# Patient Record
Sex: Male | Born: 1960 | Race: White | Hispanic: No | Marital: Married | Smoking: Former smoker
Health system: Southern US, Community
[De-identification: ages and names within clinical notes are randomized; demographics above are authoritative.]

## PROBLEM LIST (undated history)

## (undated) DIAGNOSIS — M1990 Unspecified osteoarthritis, unspecified site: Secondary | ICD-10-CM

## (undated) DIAGNOSIS — Z8619 Personal history of other infectious and parasitic diseases: Secondary | ICD-10-CM

## (undated) HISTORY — PX: VARICOCELE EXCISION: SUR582

## (undated) HISTORY — PX: BACK SURGERY: SHX140

## (undated) HISTORY — PX: NECK SURGERY: SHX720

## (undated) HISTORY — PX: OTHER SURGICAL HISTORY: SHX169

## (undated) HISTORY — DX: Personal history of other infectious and parasitic diseases: Z86.19

## (undated) HISTORY — PX: KNEE SURGERY: SHX244

## (undated) HISTORY — DX: Unspecified osteoarthritis, unspecified site: M19.90

---

## 2004-09-12 ENCOUNTER — Encounter: Admission: RE | Admit: 2004-09-12 | Discharge: 2004-09-12 | Payer: Self-pay | Admitting: Family Medicine

## 2008-06-17 ENCOUNTER — Encounter: Admission: RE | Admit: 2008-06-17 | Discharge: 2008-06-17 | Payer: Self-pay | Admitting: Specialist

## 2008-06-24 ENCOUNTER — Encounter (INDEPENDENT_AMBULATORY_CARE_PROVIDER_SITE_OTHER): Payer: Self-pay | Admitting: Specialist

## 2008-06-24 ENCOUNTER — Ambulatory Visit (HOSPITAL_COMMUNITY): Admission: RE | Admit: 2008-06-24 | Discharge: 2008-06-25 | Payer: Self-pay | Admitting: Specialist

## 2011-03-21 NOTE — Op Note (Signed)
NAME:  TRASK, VOSLER                ACCOUNT NO.:  [id number]   MEDICAL RECORD NO.:  [id number]          PATIENT TYPE:  AMB   LOCATION:  DAY                          FACILITY:  [facility]   PHYSICIAN:  Jene Every, M.D.    DATE OF BIRTH:  February 18, 1961   DATE OF PROCEDURE:  06/24/2008  DATE OF DISCHARGE:                               OPERATIVE REPORT   PREOPERATIVE DIAGNOSIS:  Spinal stenosis, herniated nucleus pulposus at  3-4, 4-5.   POSTOPERATIVE DIAGNOSIS:  Spinal stenosis, herniated nucleus pulposus at  3-4, 4-5.   PROCEDURES PERFORMED:  1. Central decompression at 3-4 for foraminotomies of L4 and      microdiskectomy L3-L4.  2. Hemilaminotomy, lateral recess decompression, foraminotomy of L4-      L5.   ANESTHESIA:  General.   SURGEON:  Jene Every, M.D.   ASSISTANTIran Ouch   BRIEF HISTORY AND INDICATIONS:  The patient is a 50 year old with acute  left lower extremity radicular pain in L3 nerve root distribution,  quadriceps weakness, numbness in the anterior thigh.  He has also had a  longstanding chronic radicular pain into the great toe.  He had a  positive neural tension sign, EHL weakness, quadriceps weakness.  MRI  indicating a disk herniation migrating cephalad into the neuroforamen of  3.  Also central and lateral recess stenosis at 4-5 on the left.  He was  indicated for decompression due to his acute neurologic deficit.  Risks  and benefits were discussed including bleeding, infection, damage to  neurovascular structures, CSF leakage, epidural fibrosis, adjacent  segment disease, need for fusion in the future, DVT, PE, anesthetic  complications and so forth.   TECHNIQUE:  The patient was placed in the supine position.  After  induction of adequate general anesthesia and 1 gram vancomycin due to  the patient's exposure to MRSA, placed prone on the Kingsley frame.  All  bony prominences were well padded.  Lumbar region was prepped and draped  in the usual sterile  fashion.  Two 18-gauge spinal needles were utilized  to localize 3-4, 4-5 interspace, confirmed with x-ray.  Incision was  made from the spinous process 3 to below 5.  Subcutaneous tissue was  dissected.  Bipolar cautery was utilized to achieve hemostasis.  Dorsolumbar fascia identified and divided in line with the skin incision  on either side of the lamina of 3 and 4 and on the left at 4-5.  McCullough retractor was placed.  Operating microscope draped and  brought onto the surgical field.   Through the small interlaminar window and the narrow lamina and to  preserve the pars, we removed the interspinous ligament and performed a  central decompression.  We performed hemilaminotomies at the caudad edge  of 3, the cephalad edge of 4 and removed ligamentum flavum from the  interspace, gently retracting the thecal sac medially, undercutting the  facet, preserving the pars.  Noted was a focal HNP at the disk space  migrating cephalad out into the 3 foramen, compressing the 3 and the 4  roots.  The neural elements were well protected.  Performed an  annulotomy and disk material extruded and was retrieved with a micro  pituitary with multiple passes.  Fragments were further mobilized from  the foramen out laterally and medially with a nerve hook and a micro  pituitary.  The micro pituitary was also used to enter the disk space.   Following this, we appropriately decompressed the 3 and 4 foramen and  beneath the thecal sac.  Only intermittent traction was utilized  throughout the case.  Disk space was copiously irrigated with antibiotic  irrigation.  I took a confirmatory radiograph with a hockey stick in the  foramen of 4.   Next, we then moved to the interspace at 4-5 and we removed the  ligamentum flavum after detaching it from the cephalad edge of 5 with a  straight curette, performed a hemilaminotomy of 5 and of the inferior  process of 4, removing ligamentum flavum from the interspace.   There was  significant lateral recess stenosis with compression of the 5 root.  We  decompressed the lateral recess to the medial border pedicle and  performed a foraminotomy of 5.  This decompressed the 5 root.  We gently  mobilized it medially.  There was a focal hardened disk protrusion there  not amenable to diskectomy.  I felt that the decompression of lateral  recess and the foraminotomy was adequate and a hockey stick probe was  placed freely out the foramen of 4 and 5 beneath the thecal sac without  residual compression noted upon the 5 root.   The disk spaces were copiously irrigated.  Inspection revealed no CSF  leakage or active bleeding.  Thrombin-soaked Gelfoam was placed in the  laminotomy defects.  The McCullough retractor was removed.  Paraspinous  muscle was irrigated with no evidence of active bleeding.  Dorsolumbar  fascia was reapproximated with #1 Vicryl interrupted figure-of-eight  sutures and through the interspinous ligament.  Subcutaneous tissue was  reapproximated with 2-0 Vicryl simple sutures.  The skin was  reapproximated with staples and the wound was dressed sterilely.  He was  placed supine on hospital bed, extubated without difficulty and  transferred to the recovery room in satisfactory condition.   The patient tolerated the procedure well, no complications.  __________.  Minimal blood loss.      Jene Every, M.D.  Electronically Signed     JB/MEDQ  D:  06/24/2008  T:  06/24/2008  Job:  [id number]

## 2011-04-07 ENCOUNTER — Other Ambulatory Visit: Payer: Self-pay | Admitting: Family Medicine

## 2011-04-07 DIAGNOSIS — M542 Cervicalgia: Secondary | ICD-10-CM

## 2011-04-08 ENCOUNTER — Ambulatory Visit
Admission: RE | Admit: 2011-04-08 | Discharge: 2011-04-08 | Disposition: A | Discharge status: Home / Self Care | Payer: BC Managed Care – PPO | Source: Ambulatory Visit | Attending: Family Medicine | Admitting: Family Medicine

## 2011-04-08 DIAGNOSIS — M542 Cervicalgia: Secondary | ICD-10-CM

## 2011-05-18 ENCOUNTER — Encounter (HOSPITAL_COMMUNITY)
Admission: RE | Admit: 2011-05-18 | Discharge: 2011-05-18 | Disposition: A | Discharge status: Home / Self Care | Payer: BC Managed Care – PPO | Source: Ambulatory Visit | Attending: Neurosurgery | Admitting: Neurosurgery

## 2011-05-18 LAB — CBC
HCT: 44.3 % (ref 39.0–52.0)
Hemoglobin: 15.9 g/dL (ref 13.0–17.0)
MCH: 33.2 pg (ref 26.0–34.0)
MCHC: 35.9 g/dL (ref 30.0–36.0)
MCV: 92.5 fL (ref 78.0–100.0)
Platelets: 191 10*3/uL (ref 150–400)
RBC: 4.79 MIL/uL (ref 4.22–5.81)
RDW: 13.3 % (ref 11.5–15.5)
WBC: 9 10*3/uL (ref 4.0–10.5)

## 2011-05-18 LAB — SURGICAL PCR SCREEN
MRSA, PCR: NEGATIVE
Staphylococcus aureus: POSITIVE — AB

## 2011-05-25 ENCOUNTER — Observation Stay (HOSPITAL_COMMUNITY)
Admission: RE | Admit: 2011-05-25 | Discharge: 2011-05-25 | Disposition: A | Discharge status: Home / Self Care | Payer: BC Managed Care – PPO | Source: Ambulatory Visit | Attending: Neurosurgery | Admitting: Neurosurgery

## 2011-05-25 ENCOUNTER — Ambulatory Visit (HOSPITAL_COMMUNITY): Payer: BC Managed Care – PPO

## 2011-05-25 DIAGNOSIS — M5000 Cervical disc disorder with myelopathy, unspecified cervical region: Secondary | ICD-10-CM | POA: Insufficient documentation

## 2011-05-25 DIAGNOSIS — M4712 Other spondylosis with myelopathy, cervical region: Principal | ICD-10-CM | POA: Insufficient documentation

## 2011-05-25 DIAGNOSIS — Z01812 Encounter for preprocedural laboratory examination: Secondary | ICD-10-CM | POA: Insufficient documentation

## 2011-05-25 DIAGNOSIS — F17200 Nicotine dependence, unspecified, uncomplicated: Secondary | ICD-10-CM | POA: Insufficient documentation

## 2011-05-25 LAB — COMPREHENSIVE METABOLIC PANEL
ALT: 36 U/L (ref 0–53)
AST: 25 U/L (ref 0–37)
Albumin: 3.9 g/dL (ref 3.5–5.2)
Alkaline Phosphatase: 78 U/L (ref 39–117)
BUN: 12 mg/dL (ref 6–23)
CO2: 27 mEq/L (ref 19–32)
Calcium: 9.9 mg/dL (ref 8.4–10.5)
Chloride: 102 mEq/L (ref 96–112)
Creatinine, Ser: 0.86 mg/dL (ref 0.50–1.35)
GFR calc Af Amer: >60 mL/min (ref >60)
GFR calc non Af Amer: >60 mL/min (ref >60)
Glucose, Bld: 101 mg/dL — ABNORMAL HIGH (ref 70–99)
Potassium: 4.7 mEq/L (ref 3.5–5.1)
Sodium: 139 mEq/L (ref 135–145)
Total Bilirubin: 0.4 mg/dL (ref 0.3–1.2)
Total Protein: 7 g/dL (ref 6.0–8.3)

## 2011-05-30 NOTE — Op Note (Signed)
NAME:  Richard Skinner, TEAL NO.:  [id number]  MEDICAL RECORD NO.:  [id number]  LOCATION:  3526                         FACILITY:  [facility]  PHYSICIAN:  Cristi Loron, M.D.DATE OF BIRTH:  May 14, 1961  DATE OF PROCEDURE:  05/25/2011 DATE OF DISCHARGE:  05/25/2011                              OPERATIVE REPORT   BRIEF HISTORY:  The patient is a 50 year old white male who suffered from neck and arm pain, numbness, tingling, etc. consistent with a cervical myelopathy.  He has failed medical management, was worked up with a cervical MRI which demonstrated that the patient had significant spondylosis at C5-L6, a large ruptured disk at C6-C7.  I discussed various treatment options with the patient and his wife and I have recommended he undergo a C5-C6 and C6-C7 anterior cervical diskectomy, fusion, and plating.  They have weighed the risks, benefits, and alternatives of surgery and decided to proceed with the operation.  PREOPERATIVE DIAGNOSES:  C5-C6 spondylosis, C6-C7 herniated nucleus pulposus, cervical radiculopathy, cervical myelopathy, cervicalgia, cervical spinal stenosis.  POSTOPERATIVE DIAGNOSES:  C5-C6 spondylosis, C6-C7 herniated nucleus pulposus, cervical radiculopathy, cervical myelopathy, cervicalgia, cervical spinal stenosis.  PROCEDURES:  C5-C6 and C6-C7 extensive anterior cervical diskectomy/decompression; C5-C6 anterior interbody arthrodesis with local morselized autograft bone and Actifuse bone graft extender; insertion of interbody prosthesis at C5-C6 and C6-C7 (Zimmer PEEK interbody prosthesis); intracervical plating C5 through C7 with Globus Titanium plate and screws.  SURGEON:  Cristi Loron, MD  ASSISTANT:  Coletta Memos, MD  ANESTHESIA:  General endotracheal.  ESTIMATED BLOOD LOSS:  100 mL.  SPECIMENS:  None.  DRAINS:  None.  COMPLICATIONS:  None.  DESCRIPTION OF PROCEDURE:  The patient was brought to the operating room by the  Anesthesia team.  General endotracheal anesthesia was induced. The patient remained in supine position.  A roll was placed under his shoulders to keep his neck in a neutral position.  His anterior cervical region was then prepared with Betadine scrub and Betadine solution. Sterile drapes were applied.  I then injected the area to be incised with Marcaine with epinephrine solution.  I used a scalpel to make a transverse incision in the patient's left anterior neck.  I used a Metzenbaum scissors to divide the platysma muscle and then to dissect medial to sternocleidomastoid muscle, jugular vein, and carotid artery. I carefully dissected down towards the anterior cervical spine, identifying the esophagus, retracting it medially.  I then used Kittner swabs to clear soft tissue from the anterior cervical spine and then inserted a bent spinal needle into the upper exposed intervertebral disk space.  We obtained intraoperative radiograph to confirm our location. We then used electrocautery to detach the more medial border of the longus colli bilaterally from the C5-C6 and C6-C7 intervertebral disk space.  We inserted a Caspar self-retaining retractor underneath the longus colli muscle bilaterally to provide exposure.  We bent the decompression at C6-C7.  We incised the C6-C7 intervertebral disk with a 15 blade scalpel.  We performed a partial intervertebral diskectomy with the pituitary forceps and the Karlin curettes.  We inserted distraction screws at C6 and C7, distracted interspace.  I then used a high-speed drill to decorticate the  vertebral endplates at C6-C7, to the remainder of C6-C7 intervertebral disk, to drill away some posterior spondylosis and to thin out the posterior longitudinal ligament.  We incised the ligament with an arachnoid knife and then removed with Kerrison punch undercutting the vertebral endplates at C6-C7 decompressing the thecal sac.  We then performed  foraminotomies about the bilateral C7 nerve root and the decompression at this level.  Of note, we did encounter a large herniated disk on the right as expected.  We removed it in multiple fragments using the pituitary forceps and the Kerrison punches.  We then repeated this procedure in an analogous fashion at C5-C6, decompressing the C5-C6 thecal sac and the bilateral C6 nerve root. This completed the decompressions.  We now turned our attention to arthrodesis.  We used trial spacers and determined to use an 8-mm prosthesis at C5-C6 and a 7-mm prosthesis at C6-C7.  We prefilled the prosthesis with combination of local autograft bone we obtained during the decompression as well as Actifuse bone graft extender.  We inserted the prosthesis and distracted the interspaces at C5-C6 and C6-C7.  We then removed distraction screws.  There was a good snug fit of the prosthesis at both levels.  We now turned our attention to the anterior spinal instrumentation.  We used a high-speed drill to drill away some ventral spondylosis at C5-C6 and C6-C7 so that plate would lay down flat.  We selected appropriate- length Globus anterior cervical plate and laid it along the anterior aspect of the vertebral bodies from C5 to C7.  We then drilled two 40 mm self-tapping at C5-C6 and C6-C7 and secured the plate to the vertebral bodies by placing two 40-mm self-tapping screws at C5, C6, and C7.  We got good bony purchase.  We then obtained intraoperative radiograph which demonstrated good position of the instrumentation.  We, therefore, secured these screws to the plate by locking each cam.  This completed the instrumentation.  We then obtained hemostasis using bipolar electrocautery.  We irrigated the wound out with bacitracin solution.  We then removed the retractor. We inspected the esophagus for any damage, there was none apparent.  We then reapproximated the patient's platysma muscle with interrupted  3-0 Vicryl suture, subcutaneous tissue with interrupted 3-0 Vicryl suture, and the skin with Steri-Strips and Benzoin.  The wound was then coated with bacitracin ointment and a sterile dressing was applied.  The drapes were removed, and the patient was subsequently extubated by Anesthesia team and transported to postanesthesia care unit in stable condition. All sponge, instrument, and needle counts were correct at the end of this case.    Cristi Loron, M.D.    JDJ/MEDQ  D:  05/25/2011  T:  05/26/2011  Job:  [id number]  Electronically Signed by Tressie Stalker M.D. on 05/30/2011 06:26:55 PM

## 2012-11-28 ENCOUNTER — Other Ambulatory Visit: Payer: Self-pay | Admitting: Neurosurgery

## 2012-11-28 DIAGNOSIS — M542 Cervicalgia: Secondary | ICD-10-CM

## 2012-12-13 ENCOUNTER — Other Ambulatory Visit: Payer: BC Managed Care – PPO

## 2017-04-13 ENCOUNTER — Ambulatory Visit: Payer: Self-pay | Admitting: Family

## 2017-05-25 ENCOUNTER — Ambulatory Visit (INDEPENDENT_AMBULATORY_CARE_PROVIDER_SITE_OTHER)
Admission: RE | Admit: 2017-05-25 | Discharge: 2017-05-25 | Disposition: A | Discharge status: Home / Self Care | Payer: BLUE CROSS/BLUE SHIELD | Source: Ambulatory Visit | Attending: Family | Admitting: Family

## 2017-05-25 ENCOUNTER — Encounter: Payer: Self-pay | Admitting: Family

## 2017-05-25 ENCOUNTER — Ambulatory Visit (INDEPENDENT_AMBULATORY_CARE_PROVIDER_SITE_OTHER): Payer: BLUE CROSS/BLUE SHIELD | Admitting: Family

## 2017-05-25 VITALS — BP 138/74 | HR 76 | Temp 98.3°F | Resp 16 | Ht 70.0 in | Wt 192.0 lb

## 2017-05-25 DIAGNOSIS — M25561 Pain in right knee: Secondary | ICD-10-CM

## 2017-05-25 DIAGNOSIS — M1712 Unilateral primary osteoarthritis, left knee: Secondary | ICD-10-CM | POA: Diagnosis not present

## 2017-05-25 DIAGNOSIS — M6258 Muscle wasting and atrophy, not elsewhere classified, other site: Secondary | ICD-10-CM | POA: Diagnosis not present

## 2017-05-25 DIAGNOSIS — G8929 Other chronic pain: Secondary | ICD-10-CM | POA: Insufficient documentation

## 2017-05-25 DIAGNOSIS — M1711 Unilateral primary osteoarthritis, right knee: Secondary | ICD-10-CM | POA: Diagnosis not present

## 2017-05-25 DIAGNOSIS — M25562 Pain in left knee: Secondary | ICD-10-CM | POA: Diagnosis not present

## 2017-05-25 MED ORDER — IBUPROFEN-FAMOTIDINE 800-26.6 MG PO TABS: 1.0000 | ORAL_TABLET | Freq: Three times a day (TID) | ORAL | 1 refills | Status: DC | PRN

## 2017-05-25 NOTE — Patient Instructions (Signed)
Thank you for choosing [facility].  SUMMARY AND INSTRUCTIONS:  Please start the Duexis 1 tablet by mouth up to 3 times daily as needed.  The will call to schedule your appointment with neurology.  We will check you x-rays today for you knee.  Ice as needed for inflammation.  Medication:  Your prescription(s) have been submitted to your pharmacy or been printed and provided for you. Please take as directed and contact our office if you believe you are having problem(s) with the medication(s) or have any questions.  Imaging / Radiology:  Please stop by radiology on the basement level of the building for your x-rays. Your results will be released to MyChart (or called to you) after review, usually within 72 hours after test completion. If any treatments or changes are necessary, you will be notified at that same time.  Follow up:  If your symptoms worsen or fail to improve, please contact our office for further instruction, or in case of emergency go directly to the emergency room at the closest medical facility.

## 2017-05-25 NOTE — Assessment & Plan Note (Signed)
Chronic pain of bilateral knees are consistent with osteoarthritis worse on the right. Obtain x-rays. Start Duexis. Consider cortisone injection pending x-ray results. Conservative therapy with ice and home exercises. Consider glucosamine. Follow up pending x-ray results.

## 2017-05-25 NOTE — Progress Notes (Signed)
Subjective:    Patient ID: Richard Skinner, male    DOB: [birth year], 56 y.o.   MRN: [id number]  Chief Complaint  Patient presents with  . Establish Care    having issues with knees, ALS? a lot of muscle and joint issues    HPI:  Richard Skinner is a 56 y.o. male who  has a past medical history of Arthritis; History of chicken pox; and History of hepatitis. and presents today for an office visit to establish care.   1.) Knees - Associated symptom of pain located in his bilateral knees have been going on for the last several years and started on the right side when he was 17 and injured it playing football. Pain is described as sharp and achy with change of gait. Modifying factors include ibuprofen. Left knee with no previous trauma. No sounds/sensations heard or felt. No new trauma or injury.   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 Described as like having heat on it on occasion. His uncle was diagnosed with ALS. Burning comes and goes with the hypersensitivity being constant. Does have numbness and tingling that is aggreavated and worsened with standing. Feels like he needs to be stretched. Does have decreased function of his right arm with cramps and atrophy. Has had previus   No Known Allergies    No outpatient prescriptions prior to visit.   No facility-administered medications prior to visit.      Past Medical History:  Diagnosis Date  . Arthritis   . History of chicken pox   . History of hepatitis       Past Surgical History:  Procedure Laterality Date  . BACK SURGERY     Microdiscectomy  . Carpel tunnel Right   . KNEE SURGERY Right    X2  . NECK SURGERY     Discectomy and fusion.  Marland Kitchen VARICOCELE EXCISION        Family History  Problem Relation Age of Onset  . Healthy Mother   . Arthritis Father   . Diabetes  Paternal Grandfather       Social History   Social History  . Marital status: Married    Spouse name: N/A  . Number of children: 1  . Years of education: 30   Occupational History  . Pattern Maker    Social History Main Topics  . Smoking status: Unknown If Ever Smoked  . Smokeless tobacco: Never Used  . Alcohol use 14.4 oz/week    24 Cans of beer per week  . Drug use: No  . Sexual activity: Yes   Other Topics Concern  . Not on file   Social History Narrative   Fun/Hobby: Fish       Review of Systems  Constitutional: Negative for chills and fever.  Respiratory: Negative for chest tightness and shortness of breath.   Cardiovascular: Negative for chest pain, palpitations and leg swelling.  Neurological: Negative for dizziness, weakness and numbness.       Objective:    BP 138/74 (BP Location: Left Arm, Patient Position: Sitting, Cuff Size: Large)   Pulse 76   Temp 98.3 F (36.8 C) (Oral)   Resp 16   Ht 5\' 10"  (1.778 m)   Wt 192 lb (87.1 kg)   SpO2 97%   BMI 27.55 kg/m  Nursing note and vital signs reviewed.  Physical Exam  Constitutional: He is oriented to person, place, and time. He appears well-developed and well-nourished. No distress.  Cardiovascular: Normal rate, regular rhythm, normal heart sounds and intact distal pulses.   Pulmonary/Chest: Effort normal and breath sounds normal.  Musculoskeletal:  Bilateral knees with no obvious deformity, discoloration or edema. Post-surgical scar noted on right knee. Tenderness noted on medial aspects of both knees. Range of motion restricted in extension on right knee and normal on left. Distal pulses and sensation are intact and appropriate. Ligamentous and meniscal testing are negative.   Right upper extremity - No obvious edema or discoloration with significant atrophy of brachioradialis and adductor pollicis. No tenderness able to be elicited. Range of motion within normal limits. Distal pulses and sensation  are intact and appropriate.   Neurological: He is alert and oriented to person, place, and time.  Skin: Skin is warm and dry.  Psychiatric: He has a normal mood and affect. His behavior is normal. Judgment and thought content normal.        Assessment & Plan:   Problem List Items Addressed This Visit      Musculoskeletal and Integument   Muscle atrophy of upper extremity    Significant atrophy of the right upper extremity in the C6 dermatome in the setting of previous cervical surgery. Concern for nerve impingement.  Refer to neurology for further assessment and possible nerve conduction study. Follow up pending referral.       Relevant Orders   Ambulatory referral to Neurology     Other   Chronic pain of both knees - Primary    Chronic pain of bilateral knees are consistent with osteoarthritis worse on the right. Obtain x-rays. Start Duexis. Consider cortisone injection pending x-ray results. Conservative therapy with ice and home exercises. Consider glucosamine. Follow up pending x-ray results.       Relevant Medications   Ibuprofen-Famotidine 800-26.6 MG TABS   Other Relevant Orders   DG Knee Complete 4 Views Left (Completed)   DG Knee Complete 4 Views Right (Completed)       I am having Richard Skinner start on Ibuprofen-Famotidine.   Meds ordered this encounter  Medications  . Ibuprofen-Famotidine 800-26.6 MG TABS    Sig: Take 1 tablet by mouth 3 (three) times daily as needed.    Dispense:  90 tablet    Refill:  1    Order Specific Question:   Supervising Provider    Answer:   Hillard DankerRAWFORD, ELIZABETH A [[id number]]     Follow-up: Return in about 1 month (around 06/25/2017), or if symptoms worsen or fail to improve.  Jeanine Luzalone, Owenn Rothermel, FNP

## 2017-05-25 NOTE — Assessment & Plan Note (Signed)
Significant atrophy of the right upper extremity in the C6 dermatome in the setting of previous cervical surgery. Concern for nerve impingement.  Refer to neurology for further assessment and possible nerve conduction study. Follow up pending referral.

## 2017-06-08 ENCOUNTER — Encounter: Payer: Self-pay | Admitting: Family

## 2017-06-21 ENCOUNTER — Other Ambulatory Visit: Payer: Self-pay | Admitting: Family

## 2018-01-08 IMAGING — DX DG KNEE COMPLETE 4+V*L*
4 series · 4 of 4 positions shown · non-contrast
Comparison: None

CLINICAL DATA: Chronic BILATERAL knee pain, history of RIGHT knee
surgery

EXAM:
LEFT KNEE - COMPLETE 4+ VIEW

[knee ap]
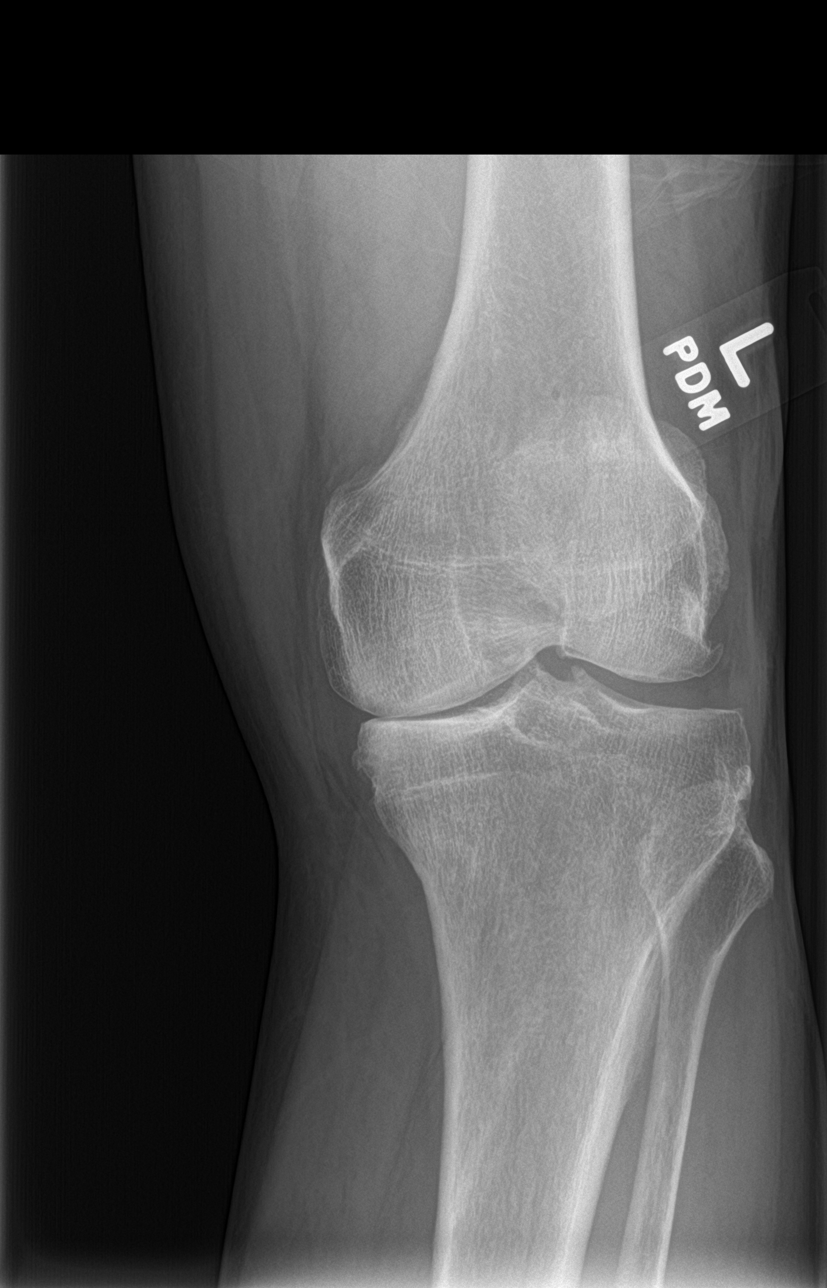

[knee tunnel]
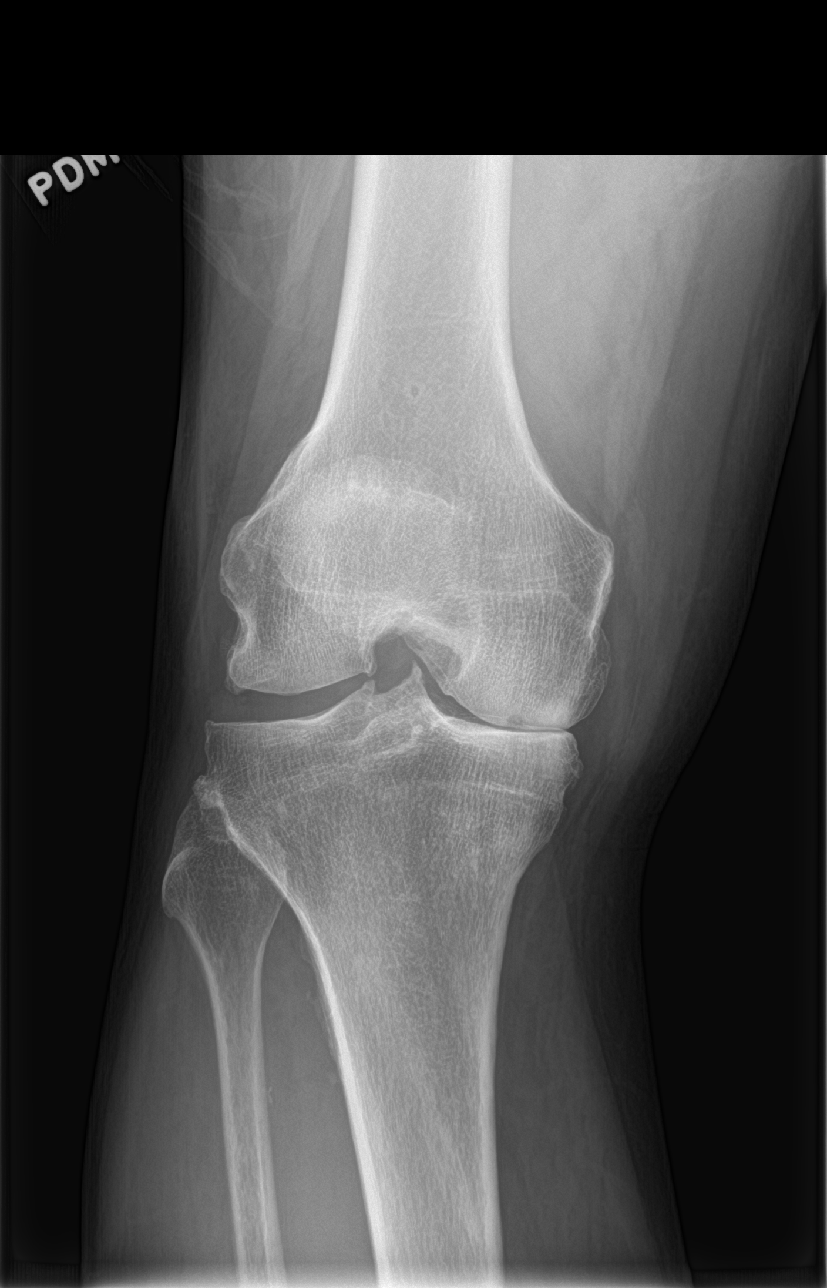

[knee lat]
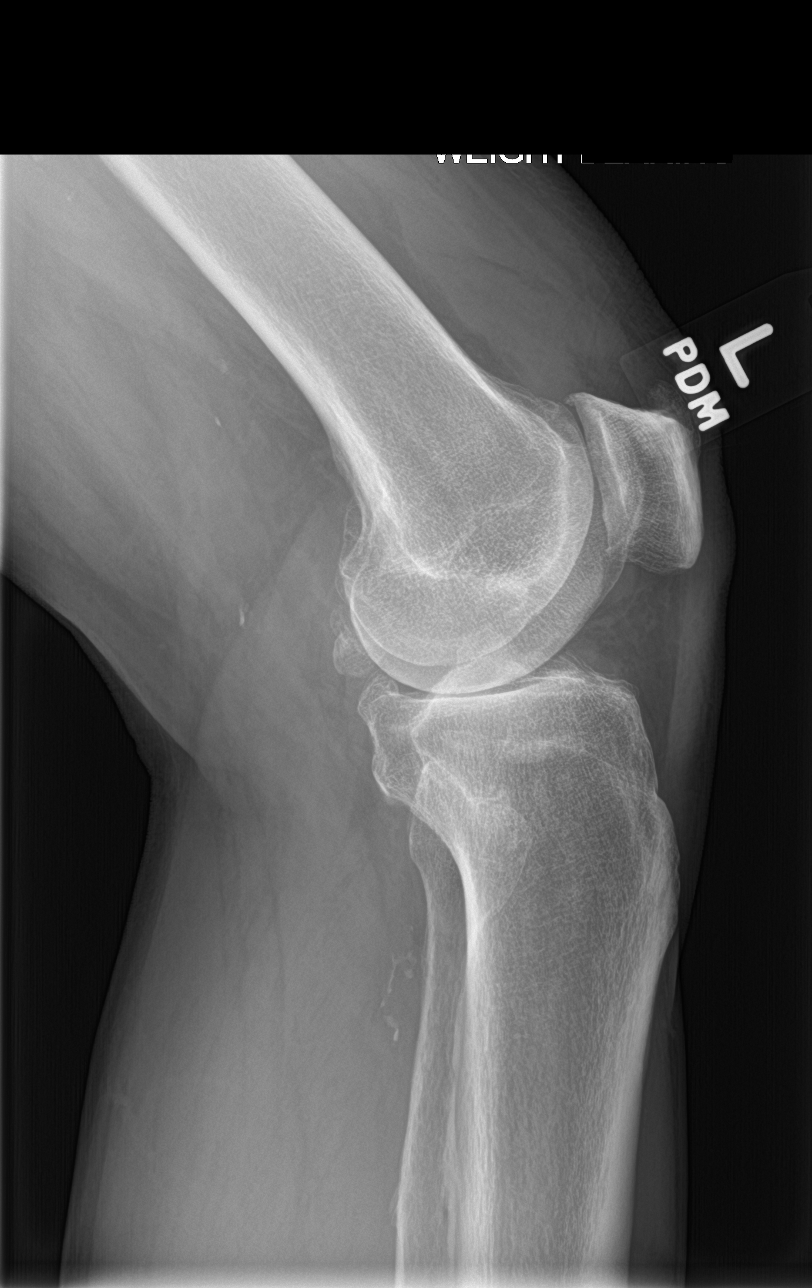

[sunrise]
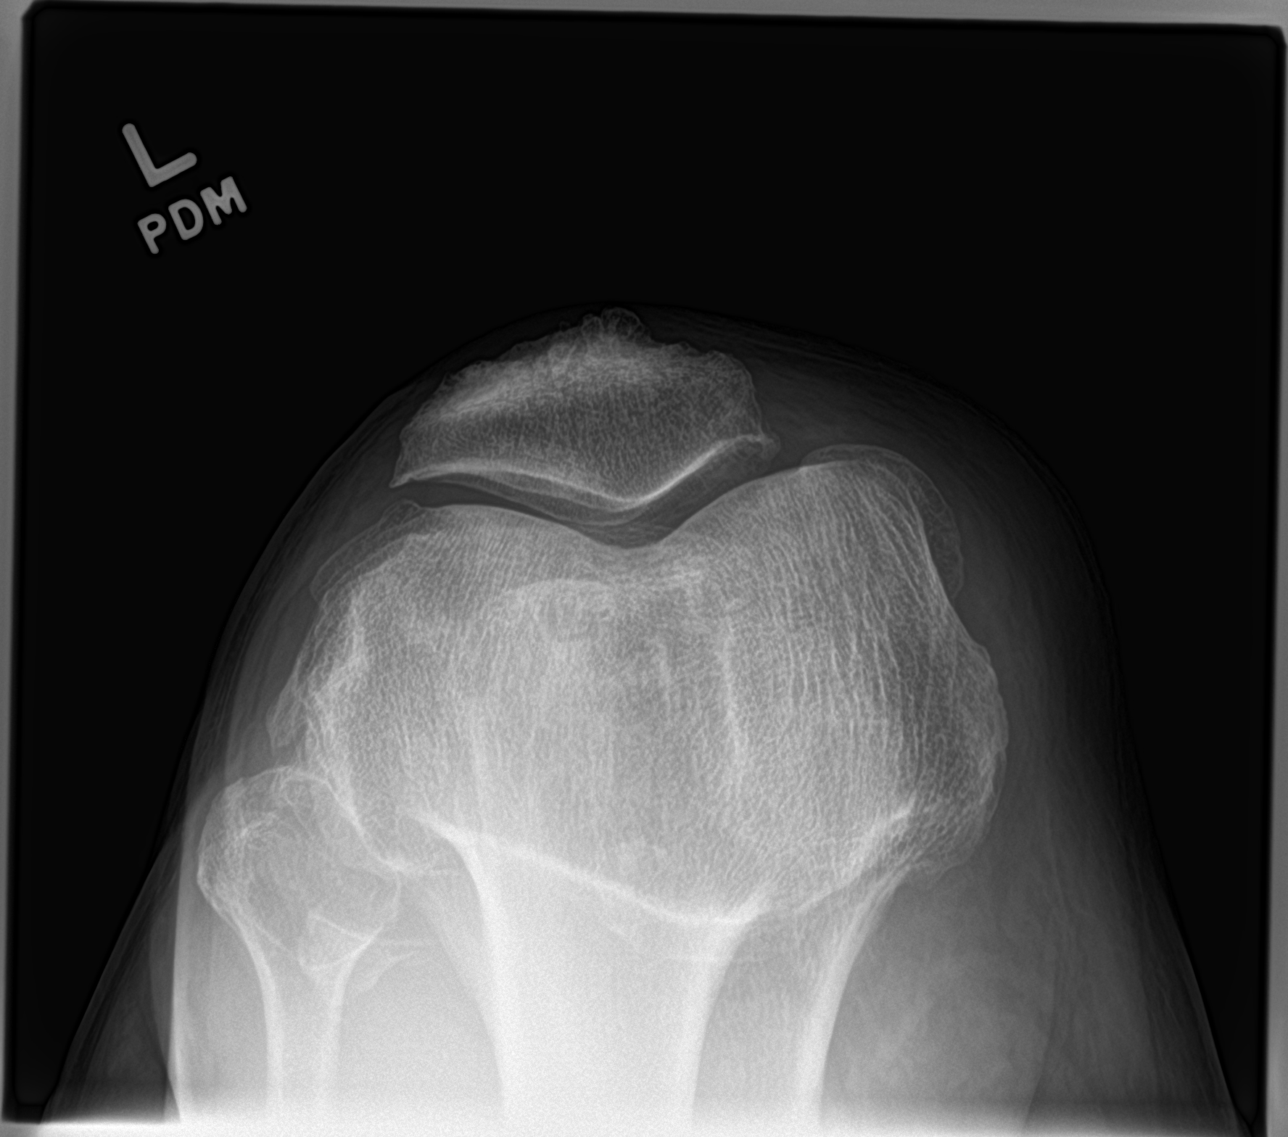

[4 of 4 positions shown; findings below may reference images not displayed]

FINDINGS: Osseous demineralization.

Tricompartmental osteoarthritic changes with joint space narrowing
and spur formation greatest at medial compartment.

No acute fracture, dislocation, or bone destruction.

Small knee joint effusion.

Few scattered atherosclerotic calcifications.
IMPRESSION: Osteoarthritic changes LEFT knee with small knee joint effusion.

## 2018-06-04 ENCOUNTER — Encounter: Payer: Self-pay | Admitting: Internal Medicine

## 2018-06-04 ENCOUNTER — Ambulatory Visit: Payer: BLUE CROSS/BLUE SHIELD | Admitting: Internal Medicine

## 2018-06-04 VITALS — BP 134/82 | HR 61 | Temp 98.1°F | Wt 201.0 lb

## 2018-06-04 DIAGNOSIS — R21 Rash and other nonspecific skin eruption: Secondary | ICD-10-CM | POA: Diagnosis not present

## 2018-06-04 MED ORDER — PREDNISONE 10 MG PO TABS: ORAL_TABLET | ORAL | 0 refills | Status: DC

## 2018-06-04 NOTE — Patient Instructions (Signed)
Vasculitis Vasculitis is swelling (inflammation) of the blood vessels. With vasculitis, the blood vessels can become thick, narrow, scarred, or weak, and enough blood may not be able to flow through them. This can cause damage to the muscles, kidneys, lungs, brain, and other parts of the body. There are many types of vasculitis. Some last only a short time while others last a long time. What are the causes? The exact cause is unknown, but vasculitis can develop when the body's immune system attacks its own blood vessels. This attack can be caused by:  An infection.  An immune system disease, such as lupus, rheumatoid arthritis, or scleroderma.  An allergic reaction to a medicine.  Cancer that affects blood cells, such as leukemia and lymphoma.  What increases the risk?  Being a smoker.  Being under stress.  Having a physical injury. What are the signs or symptoms? Symptoms vary depending on the type of vasculitis you have. Symptoms that are common to all types of vasculitis include:  Fever.  Poor appetite.  Weight loss.  Feeling very tired.  Having aches and pains.  Weakness.  Numbness in an area of your body.  Symptoms for specific types of vasculitis include:  Skin problems, such as sores, spots, or rashes.  Trouble seeing.  Trouble breathing.  Blood in your urine.  Headaches.  Stomach pain.  Stuffy or bloody nose.  How is this diagnosed? Your health care provider will ask about your symptoms and do a physical exam. You may have tests done, such as:  A complete blood count (CBC).  Erythrocyte sedimentation, also called sed rate test.  C-reactive protein (CRP).  Antineutrophil cytoplasmic antibodies (ANCA).  A urine test.  A biopsy of a blood vessel.  A nerve conduction study.  Imaging tests, such as: ? X-rays. ? A CT scan. ? An ultrasound. ? An MRI. ? Angiography.  How is this treated? Treatment will depend on the type of vasculitis you  have and how severe it is. Sometimes treatment is not needed. Treatment often includes:  Medicines.  Physical therapy or occupational therapy. This helps strengthen muscles that were weakened by the disease.  You will need to see your health care provider while you are being treated. During follow-up visits, your health care provider will:  Perform blood tests and bone density tests.  Check your blood pressure and blood sugar.  Check for side effects of any medicines you are taking.  Vasculitis cannot always be cured. Sometimes symptoms go away but the disease does not (the disease goes in remission). If symptoms return, increased treatment may be needed. Follow these instructions at home:  Take medicines only as directed by your health care provider.  Keep all follow-up visits as directed by your health care provider. This is important.  Exercise. Talk with your health care provider about what exercises are okay for you to do. Usually exercises that increase your heart rate (aerobic exercise), such as walking, are recommended. Aerobic exercise helps control your blood pressure and prevent bone loss.  Follow a healthy diet. Include healthy sources of protein, fruits, vegetables, and whole grains in your diet.  Learn as much as you can about vasculitis, and consider joining a support group. Understanding your condition and talking with others who have it may help you cope. Talk with your health care provider if you feel stressed, anxious, or depressed. Contact a health care provider if:  Your symptoms return, or you have new symptoms.  Your fever, fatigue, headache, or   weight loss gets worse.  You have signs of infection, such as fever, warmth, tenderness, redness, or swelling. Get help right away if:  Your vision gets worse.  Your pain does not go away, even after you take pain medicine.  You have chest or stomach pain.  You have trouble breathing.  One side of your face or  body suddenly becomes weak or numb.  Your nose bleeds.  There is blood in your urine. This information is not intended to replace advice given to you by your health care provider. Make sure you discuss any questions you have with your health care provider. Document Released: 08/19/2009 Document Revised: 03/30/2016 Document Reviewed: 12/17/2013 Elsevier Interactive Patient Education  2018 [employer]  

## 2018-06-05 ENCOUNTER — Encounter: Payer: Self-pay | Admitting: Internal Medicine

## 2018-06-05 NOTE — Progress Notes (Signed)
Subjective:    Patient ID: Richard Skinner, male    DOB: 10/07/1961, 57 y.o.   MRN: [id number]  HPI  Pt presents to the clinic today with c/o a rash. He noticed this 3-4 weeks ago, on the outside of his left calf. He reports the rash was red, mildly itchy. That area seems to be impriving but he has a new area over his left shin. He has not come in contact with anything that he is allergic to. He denies changes in soap, lotion or detergents. No on in his home has a similar rash. He has tried moisturizer with minimal relief.  Review of Systems      Past Medical History:  Diagnosis Date  . Arthritis   . History of chicken pox   . History of hepatitis     Current Outpatient Medications  Medication Sig Dispense Refill  . ibuprofen (ADVIL,MOTRIN) 200 MG tablet Take 400-600 mg by mouth every 6 (six) hours as needed.    . DUEXIS 800-26.6 MG TABS TAKE ONE TABLET BY MOUTH THREE TIMES DAILY AS NEEDED (Patient not taking: Reported on 06/04/2018) 90 tablet 0  . predniSONE (DELTASONE) 10 MG tablet Take 6 tabs day 1, 5 tabs day 2, 4 tabs day 3, 3 tabs day 4, 2 tabs day 5, 1 tab day 6 21 tablet 0   No current facility-administered medications for this visit.     No Known Allergies  Family History  Problem Relation Age of Onset  . Healthy Mother   . Arthritis Father   . Diabetes Paternal Grandfather     Social History   Socioeconomic History  . Marital status: Married    Spouse name: Not on file  . Number of children: 1  . Years of education: 75  . Highest education level: Not on file  Occupational History  . Occupation: Counsellor  Social Needs  . Financial resource strain: Not on file  . Food insecurity:    Worry: Not on file    Inability: Not on file  . Transportation needs:    Medical: Not on file    Non-medical: Not on file  Tobacco Use  . Smoking status: Unknown If Ever Smoked  . Smokeless tobacco: Never Used  Substance and Sexual Activity  . Alcohol use: Yes   Alcohol/week: 14.4 oz    Types: 24 Cans of beer per week  . Drug use: No  . Sexual activity: Yes  Lifestyle  . Physical activity:    Days per week: Not on file    Minutes per session: Not on file  . Stress: Not on file  Relationships  . Social connections:    Talks on phone: Not on file    Gets together: Not on file    Attends religious service: Not on file    Active member of club or organization: Not on file    Attends meetings of clubs or organizations: Not on file    Relationship status: Not on file  . Intimate partner violence:    Fear of current or ex partner: Not on file    Emotionally abused: Not on file    Physically abused: Not on file    Forced sexual activity: Not on file  Other Topics Concern  . Not on file  Social History Narrative   Fun/Hobby: Fish      Constitutional: Denies fever, malaise, fatigue, headache or abrupt weight changes.  Skin: Pt reports rash of left lower leg. Denies  lesions or ulcercations.    No other specific complaints in a complete review of systems (except as listed in HPI above).  Objective:   Physical Exam   BP 134/82   Pulse 61   Temp 98.1 F (36.7 C) (Oral)   Wt 201 lb (91.2 kg)   SpO2 96%   BMI 28.84 kg/m  Wt Readings from Last 3 Encounters:  06/04/18 201 lb (91.2 kg)  05/25/17 192 lb (87.1 kg)    General: Appears his stated age, well developed, well nourished in NAD. Skin: 1.5 cm circular area of petechia noted on left anterior shin. He has a 6 cm x 3 cm area of hyperpigmentation noted to the left lateral calf. Cardiovascular: Pedal pulse 2+ bilaterally Respiratory: CTA bilaterally. Musculoskeletal: No swelling noted of lower extremeties. No difficulty with gait.  Neurological: Alert and oriented. Sensation intact to BLE.  BMET    Component Value Date/Time   NA 139 05/25/2011 0557   K 4.7 05/25/2011 0557   CL 102 05/25/2011 0557   CO2 27 05/25/2011 0557   GLUCOSE 101 (H) 05/25/2011 0557   BUN 12 05/25/2011  0557   CREATININE 0.86 05/25/2011 0557   CALCIUM 9.9 05/25/2011 0557   GFRNONAA >60 05/25/2011 0557   GFRAA >60 05/25/2011 0557    Lipid Panel  No results found for: CHOL, TRIG, HDL, CHOLHDL, VLDL, LDLCALC  CBC    Component Value Date/Time   WBC 9.0 05/18/2011 1558   RBC 4.79 05/18/2011 1558   HGB 15.9 05/18/2011 1558   HCT 44.3 05/18/2011 1558   PLT 191 05/18/2011 1558   MCV 92.5 05/18/2011 1558   MCH 33.2 05/18/2011 1558   MCHC 35.9 05/18/2011 1558   RDW 13.3 05/18/2011 1558    Hgb A1C No results found for: HGBA1C         Assessment & Plan:   Rash:  Not c/w cellulitis Seems more like vasculitis eRx for Pred Taper x 6 days Follow up with your new PCP, sooner if worsens  Return precautions discussed Nicki Reaperegina [place] Ma[place], NP

## 2018-07-10 ENCOUNTER — Encounter: Payer: BLUE CROSS/BLUE SHIELD | Admitting: Family Medicine

## 2018-07-12 ENCOUNTER — Ambulatory Visit: Payer: BLUE CROSS/BLUE SHIELD | Admitting: Family Medicine

## 2018-07-12 ENCOUNTER — Encounter: Payer: Self-pay | Admitting: Family Medicine

## 2018-07-12 VITALS — BP 140/78 | HR 77 | Temp 98.5°F | Ht 70.0 in | Wt 196.0 lb

## 2018-07-12 DIAGNOSIS — M25561 Pain in right knee: Secondary | ICD-10-CM

## 2018-07-12 DIAGNOSIS — Z13220 Encounter for screening for lipoid disorders: Secondary | ICD-10-CM | POA: Diagnosis not present

## 2018-07-12 DIAGNOSIS — R21 Rash and other nonspecific skin eruption: Secondary | ICD-10-CM | POA: Diagnosis not present

## 2018-07-12 DIAGNOSIS — M25562 Pain in left knee: Secondary | ICD-10-CM

## 2018-07-12 DIAGNOSIS — Z1329 Encounter for screening for other suspected endocrine disorder: Secondary | ICD-10-CM | POA: Diagnosis not present

## 2018-07-12 DIAGNOSIS — G8929 Other chronic pain: Secondary | ICD-10-CM

## 2018-07-12 DIAGNOSIS — M170 Bilateral primary osteoarthritis of knee: Secondary | ICD-10-CM

## 2018-07-12 DIAGNOSIS — Z131 Encounter for screening for diabetes mellitus: Secondary | ICD-10-CM | POA: Diagnosis not present

## 2018-07-12 DIAGNOSIS — Z720 Tobacco use: Secondary | ICD-10-CM

## 2018-07-12 DIAGNOSIS — M6258 Muscle wasting and atrophy, not elsewhere classified, other site: Secondary | ICD-10-CM | POA: Diagnosis not present

## 2018-07-12 DIAGNOSIS — K429 Umbilical hernia without obstruction or gangrene: Secondary | ICD-10-CM

## 2018-07-12 DIAGNOSIS — E782 Mixed hyperlipidemia: Secondary | ICD-10-CM

## 2018-07-12 MED ORDER — TRIAMCINOLONE ACETONIDE 0.1 % EX CREA: 1.0000 "application " | TOPICAL_CREAM | Freq: Two times a day (BID) | CUTANEOUS | 0 refills | Status: DC

## 2018-07-12 NOTE — Patient Instructions (Signed)
Good to see you  PLease go to lab, then see a referrals coordinator to schedule appointment with Dr. Magnus Ivan  Schedule a complete physical exam for 8-12 weeks  I will notify you of lab results

## 2018-07-12 NOTE — Progress Notes (Signed)
Subjective:    Patient ID: Richard Skinner, male    DOB: April 30, 1961, 57 y.o.   MRN: [id number]  HPI This is a 57 yo male who presents today to establish care, transferring from [employer]. Works at [employer].   Was seen by Nicki Reaper 06/04/28 with a rash on left calf. Resolved some from initial presentation, little relief with prednisone.   Bilateral knee pain. Takes 2-3 ibuprofen or 1 Goody's most days with some relief of pain. Has been told he needs knee replacements. Gets very little time off work.   Had neck surgery 05/25/11. Has noticed some decreased muscle tone in right arm and right leg. Skin feels hypersensitive along arms and legs. This was noted at visit 05/25/18 with Jeanine Luz who referred patient to neurology. Patient was not able to be reached for scheduling. Patient not currently interested in work up when offered EMG/nerve conduction studies and/or neurology referral.   Has noticed "blister " of belly button, goes down during night.   Sleeps well. Little regular exercise. Denies chest pain, SOB, abdominal pain/diarrhea/constipation, dysuria.   Last CPE- unknown Colonoscopy- never Tdap- unsure Flu- declines Exercise- household chores only    Past Medical History:  Diagnosis Date  . Arthritis   . History of chicken pox   . History of hepatitis    Past Surgical History:  Procedure Laterality Date  . BACK SURGERY     Microdiscectomy  . Carpel tunnel Right   . KNEE SURGERY Right    X2  . NECK SURGERY     Discectomy and fusion.  Marland Kitchen VARICOCELE EXCISION     Family History  Problem Relation Age of Onset  . Healthy Mother   . Arthritis Father   . Diabetes Paternal Grandfather    Social History   Tobacco Use  . Smoking status: Current Every Day Smoker  . Smokeless tobacco: Never Used  Substance Use Topics  . Alcohol use: Yes    Alcohol/week: 24.0 standard drinks    Types: 24 Cans of beer per week  . Drug use: No      Review of  Systems Per HPI    Objective:   Physical Exam  Constitutional: He is oriented to person, place, and time. He appears well-developed and well-nourished.  HENT:  Head: Normocephalic and atraumatic.  Eyes: Conjunctivae are normal.  Cardiovascular: Normal rate, regular rhythm and normal heart sounds.  Pulmonary/Chest: Effort normal. He has wheezes (faint expiratory).  Abdominal: Soft. A hernia (small, soft umbilical, non tender) is present.  Neurological: He is alert and oriented to person, place, and time.  Skin: Skin is warm and dry. Rash (left calf with erythematous, macular rash. No drainage or warmth. ) noted.  Psychiatric: He has a normal mood and affect. His behavior is normal. Judgment and thought content normal.  Vitals reviewed.     BP 140/78 (BP Location: Right Arm, Patient Position: Sitting, Cuff Size: Normal)   Pulse 77   Temp 98.5 F (36.9 C) (Oral)   Ht 5\' 10"  (1.778 m)   Wt 196 lb (88.9 kg)   SpO2 94%   BMI 28.12 kg/m  Wt Readings from Last 3 Encounters:  07/12/18 196 lb (88.9 kg)  06/04/18 201 lb (91.2 kg)  05/25/17 192 lb (87.1 kg)       Assessment & Plan:  1. Chronic pain of both knees - this seems to be his most pressing concern - Ambulatory referral to Orthopedic Surgery  2. Osteoarthritis of  both knees, unspecified osteoarthritis type - Ambulatory referral to Orthopedic Surgery  3. Rash - slowly resolving - triamcinolone cream (KENALOG) 0.1 %; Apply 1 application topically 2 (two) times daily. For up to 10 days  Dispense: 45 g; Refill: 0 - CBC with Differential - Comprehensive metabolic panel - Sedimentation rate - ANA  4. Muscle atrophy of upper extremity - ongoing problem for which he declines testing/referral at this time. Concern for nerve impingement from previous cervical issues  5. Screening for thyroid disorder - TSH  6. Screening for lipid disorders - Lipid Panel  7. Screening for diabetes mellitus - Hemoglobin A1c  8. Tobacco  abuse - not currently interested in quitting  9. Umbilical hernia without obstruction and without gangrene - offered surgical referral, he denies at this time, RTC precautions reviewed  - follow up in 8-12 weeks for CPE, will address colon cancer screening and additional health maintenance at that time    Olean Ree, FNP-BC  [facility], [facility]  07/15/2018 8:35 AM

## 2018-07-14 LAB — CBC WITH DIFFERENTIAL/PLATELET
Basophils Absolute: 92 cells/uL (ref 0–200)
Basophils Relative: 1.4 %
Eosinophils Absolute: 231 cells/uL (ref 15–500)
Eosinophils Relative: 3.5 %
HCT: 46.4 % (ref 38.5–50.0)
Hemoglobin: 16.4 g/dL (ref 13.2–17.1)
Lymphs Abs: 1868 cells/uL (ref 850–3900)
MCH: 33.3 pg — ABNORMAL HIGH (ref 27.0–33.0)
MCHC: 35.3 g/dL (ref 32.0–36.0)
MCV: 94.1 fL (ref 80.0–100.0)
MPV: 11 fL (ref 7.5–12.5)
Monocytes Relative: 10.5 %
Neutro Abs: 3716 cells/uL (ref 1500–7800)
Neutrophils Relative %: 56.3 %
Platelets: 136 10*3/uL — ABNORMAL LOW (ref 140–400)
RBC: 4.93 10*6/uL (ref 4.20–5.80)
RDW: 13.5 % (ref 11.0–15.0)
Total Lymphocyte: 28.3 %
WBC mixed population: 693 cells/uL (ref 200–950)
WBC: 6.6 10*3/uL (ref 3.8–10.8)

## 2018-07-14 LAB — LIPID PANEL
CHOLESTEROL: 223 mg/dL — AB (ref <200)
HDL: 38 mg/dL — ABNORMAL LOW (ref >40)
LDL CHOLESTEROL (CALC): 150 mg/dL — AB
Non-HDL Cholesterol (Calc): 185 mg/dL (calc) — ABNORMAL HIGH (ref <130)
TRIGLYCERIDES: 212 mg/dL — AB (ref <150)
Total CHOL/HDL Ratio: 5.9 (calc) — ABNORMAL HIGH (ref <5.0)

## 2018-07-14 LAB — HEMOGLOBIN A1C
EAG (MMOL/L): 6 (calc)
Hgb A1c MFr Bld: 5.4 % of total Hgb (ref <5.7)
Mean Plasma Glucose: 108 (calc)

## 2018-07-14 LAB — COMPREHENSIVE METABOLIC PANEL
AG Ratio: 1.6 (calc) (ref 1.0–2.5)
ALT: 58 U/L — ABNORMAL HIGH (ref 9–46)
AST: 75 U/L — ABNORMAL HIGH (ref 10–35)
Albumin: 4.2 g/dL (ref 3.6–5.1)
Alkaline phosphatase (APISO): 80 U/L (ref 40–115)
BUN: 10 mg/dL (ref 7–25)
CO2: 27 mmol/L (ref 20–32)
Calcium: 9.8 mg/dL (ref 8.6–10.3)
Chloride: 103 mmol/L (ref 98–110)
Creat: 0.99 mg/dL (ref 0.70–1.33)
Globulin: 2.6 g/dL (calc) (ref 1.9–3.7)
Glucose, Bld: 95 mg/dL (ref 65–99)
Potassium: 4.5 mmol/L (ref 3.5–5.3)
Sodium: 137 mmol/L (ref 135–146)
Total Bilirubin: 0.7 mg/dL (ref 0.2–1.2)
Total Protein: 6.8 g/dL (ref 6.1–8.1)

## 2018-07-14 LAB — SEDIMENTATION RATE: Sed Rate: 14 mm/h (ref 0–20)

## 2018-07-14 LAB — ANA: Anti Nuclear Antibody(ANA): NEGATIVE

## 2018-07-14 LAB — TSH: TSH: 1.45 mIU/L (ref 0.40–4.50)

## 2018-07-15 DIAGNOSIS — Z720 Tobacco use: Secondary | ICD-10-CM | POA: Insufficient documentation

## 2018-07-15 DIAGNOSIS — M170 Bilateral primary osteoarthritis of knee: Secondary | ICD-10-CM | POA: Insufficient documentation

## 2018-07-16 ENCOUNTER — Telehealth: Payer: Self-pay

## 2018-07-16 NOTE — Telephone Encounter (Signed)
Copied from CRM [id number]. Topic: Quick Communication - See Telephone Encounter >> Jul 16, 2018 10:30 AM Herby Abraham C wrote: CRM for notification. See Telephone encounter for: 07/16/18.  Pt would like to know his results.  CB: [phone]  OR [phone] - (spouse )

## 2018-07-17 ENCOUNTER — Telehealth: Payer: Self-pay | Admitting: Family Medicine

## 2018-07-17 MED ORDER — ATORVASTATIN CALCIUM 20 MG PO TABS: 20.0000 mg | ORAL_TABLET | Freq: Every day | ORAL | 3 refills | Status: DC

## 2018-07-17 NOTE — Telephone Encounter (Signed)
Reviewed patient's cholesterol numbers with him. Discussed diet and exercise recommendations per physician's note. He will pick up medication but undecided about starting the cholesterol medication at this time. He has a follow up scheduled.

## 2018-07-17 NOTE — Addendum Note (Signed)
Addended by: Olean Ree B on: 07/17/2018 05:58 AM   Modules accepted: Orders

## 2018-07-17 NOTE — Telephone Encounter (Signed)
Called and spoke to patient informing him os results. Understanding verbalized nothing further needed. Patient states that he cannot log in to his mychart.

## 2018-07-17 NOTE — Telephone Encounter (Signed)
Please call and let them know results sent through Mychart. Can advise them of results and recommendations per result note.

## 2018-07-24 ENCOUNTER — Ambulatory Visit (INDEPENDENT_AMBULATORY_CARE_PROVIDER_SITE_OTHER): Payer: Self-pay | Admitting: Orthopaedic Surgery

## 2018-08-16 ENCOUNTER — Ambulatory Visit (INDEPENDENT_AMBULATORY_CARE_PROVIDER_SITE_OTHER): Payer: BLUE CROSS/BLUE SHIELD | Admitting: Family Medicine

## 2018-08-16 ENCOUNTER — Encounter: Payer: Self-pay | Admitting: Family Medicine

## 2018-08-16 VITALS — BP 128/70 | HR 77 | Temp 98.1°F | Ht 68.0 in | Wt 193.0 lb

## 2018-08-16 DIAGNOSIS — Z114 Encounter for screening for human immunodeficiency virus [HIV]: Secondary | ICD-10-CM | POA: Diagnosis not present

## 2018-08-16 DIAGNOSIS — Z0000 Encounter for general adult medical examination without abnormal findings: Secondary | ICD-10-CM

## 2018-08-16 DIAGNOSIS — R945 Abnormal results of liver function studies: Secondary | ICD-10-CM | POA: Diagnosis not present

## 2018-08-16 DIAGNOSIS — R200 Anesthesia of skin: Secondary | ICD-10-CM | POA: Diagnosis not present

## 2018-08-16 DIAGNOSIS — R21 Rash and other nonspecific skin eruption: Secondary | ICD-10-CM | POA: Diagnosis not present

## 2018-08-16 DIAGNOSIS — Z125 Encounter for screening for malignant neoplasm of prostate: Secondary | ICD-10-CM

## 2018-08-16 DIAGNOSIS — Z1211 Encounter for screening for malignant neoplasm of colon: Secondary | ICD-10-CM

## 2018-08-16 DIAGNOSIS — Z720 Tobacco use: Secondary | ICD-10-CM

## 2018-08-16 DIAGNOSIS — R7989 Other specified abnormal findings of blood chemistry: Secondary | ICD-10-CM

## 2018-08-16 NOTE — Patient Instructions (Signed)
Good to see you   Keep up the good work with diet and exercise!   Follow up in 6 months for recheck cholesterol with dietary changes  For your shin rash, please use over the counter antifungal cream twice a day for up to 14 days- can stop day after clearing occurs if before 14 days.   Keeping you healthy  Get these tests  Blood pressure- Have your blood pressure checked once a year by your healthcare provider.  Normal blood pressure is 120/80  Weight- Have your body mass index (BMI) calculated to screen for obesity.  BMI is a measure of body fat based on height and weight. You can also calculate your own BMI at [email or web address].  Cholesterol- Have your cholesterol checked every year.  Diabetes- Have your blood sugar checked regularly if you have high blood pressure, high cholesterol, have a family history of diabetes or if you are overweight.  Screening for Colon Cancer- Colonoscopy starting at age 43.  Screening may begin sooner depending on your family history and other health conditions. Follow up colonoscopy as directed by your Gastroenterologist.  Screening for Prostate Cancer- Both blood work (PSA) and a rectal exam help screen for Prostate Cancer.  Screening begins at age 4 with African-American men and at age 52 with Caucasian men.  Screening may begin sooner depending on your family history.  Take these medicines  Aspirin- One aspirin daily can help prevent Heart disease and Stroke.  Flu shot- Every fall.  Tetanus- Every 10 years.  Zostavax- Once after the age of 34 to prevent Shingles.  Pneumonia shot- Once after the age of 73; if you are younger than 28, ask your healthcare provider if you need a Pneumonia shot.  Take these steps  Don't smoke- If you do smoke, talk to your doctor about quitting.  For tips on how to quit, go to www.smokefree.gov or call 1-800-QUIT-NOW.  Be physically active- Exercise 5 days a week for at least 30 minutes.  If you are not  already physically active start slow and gradually work up to 30 minutes of moderate physical activity.  Examples of moderate activity include walking briskly, mowing the yard, dancing, swimming, bicycling, etc.  Eat a healthy diet- Eat a variety of healthy food such as fruits, vegetables, low fat milk, low fat cheese, yogurt, lean meant, poultry, fish, beans, tofu, etc. For more information go to www.thenutritionsource.org  Drink alcohol in moderation- Limit alcohol intake to less than two drinks a day. Never drink and drive.  Dentist- Brush and floss twice daily; visit your dentist twice a year.  Depression- Your emotional health is as important as your physical health. If you're feeling down, or losing interest in things you would normally enjoy please talk to your healthcare provider.  Eye exam- Visit your eye doctor every year.  Safe sex- If you may be exposed to a sexually transmitted infection, use a condom.  Seat belts- Seat belts can save your life; always wear one.  Smoke/Carbon Monoxide detectors- These detectors need to be installed on the appropriate level of your home.  Replace batteries at least once a year.  Skin cancer- When out in the sun, cover up and use sunscreen 15 SPF or higher.  Violence- If anyone is threatening you, please tell your healthcare provider.  Living Will/ Health care power of attorney- Speak with your healthcare provider and family.

## 2018-08-16 NOTE — Progress Notes (Signed)
Subjective:    Patient ID: Richard Skinner, male    DOB: [birth year], 59 y.o.   MRN: [id number]  HPI This is a 57 yo male who presents today for CPE.    Last CPE- unknown PSA- never Colonoscopy- never, agreeable to referral for screening Tdap- unknown  Flu- declines Dental- over due Eye- over due Exercise- has increased activity  Hyperlipidemia- didn't tolerate atorvastatin, took for 3 weeks, felt confused and had trouble with his memory. Stopped it about 1 week ago, symptoms nearly completley resolved.   Knee pain- didn't go to appointment at [facility], states that they were unable to tell him approximate cost and he has to pay out of pocket for specialty care, so he chose not to go. Does not want different referral at this time.   Elevated LFTs from last visit. He thinks he had hepatitis A as a child. Has decreased alcohol intake since last visit.    Past Medical History:  Diagnosis Date  . Arthritis   . History of chicken pox   . History of hepatitis    Past Surgical History:  Procedure Laterality Date  . BACK SURGERY     Microdiscectomy  . Carpel tunnel Right   . KNEE SURGERY Right    X2  . NECK SURGERY     Discectomy and fusion.  Marland Kitchen VARICOCELE EXCISION     Family History  Problem Relation Age of Onset  . Healthy Mother   . Arthritis Father   . Diabetes Paternal Grandfather    Social History   Tobacco Use  . Smoking status: Current Every Day Smoker  . Smokeless tobacco: Never Used  Substance Use Topics  . Alcohol use: Yes    Alcohol/week: 24.0 standard drinks    Types: 24 Cans of beer per week  . Drug use: No       Review of Systems  Constitutional: Negative.   HENT: Negative.   Eyes: Negative.   Respiratory: Negative.   Cardiovascular: Negative.   Endocrine: Negative.   Genitourinary: Negative.   Musculoskeletal: Positive for arthralgias (knees).  Skin: Positive for rash (continued itching and rash on anterior shins, no improvement  with triamcinolone cream. No known irritatnts. ).  Allergic/Immunologic: Negative.   Neurological: Positive for weakness (arms, R > L, long standing, history neck surgery. ). Negative for dizziness, light-headedness and headaches.  Hematological: Negative.   Psychiatric/Behavioral: Negative.        Objective:   Physical Exam Physical Exam  Constitutional: He is oriented to person, place, and time. He appears well-developed and well-nourished.  HENT:  Head: Normocephalic and atraumatic.  Right Ear: External ear normal.  Left Ear: External ear normal.  Nose: Nose normal.  Mouth/Throat: Oropharynx is clear and moist.  Eyes: Conjunctivae are normal. Pupils are equal, round, and reactive to light.  Neck: Normal range of motion. Neck supple.  Cardiovascular: Normal rate, regular rhythm, normal heart sounds and intact distal pulses.   Pulmonary/Chest: Effort normal and breath sounds normal.  Abdominal: Soft. Bowel sounds are normal. Reducible umbilical hernia. Hernia confirmed negative in the right inguinal area and confirmed negative in the left inguinal area.  Genitourinary: Testes normal and penis normal. Circumcised.  Musculoskeletal: Normal range of motion. He exhibits no edema or tenderness.       Cervical back: Normal.       Thoracic back: Normal.       Lumbar back: Normal.  Lymphadenopathy:    He has no cervical adenopathy.  Right: No inguinal adenopathy present.       Left: No inguinal adenopathy present.  Neurological: He is alert and oriented to person, place, and time.Skin: Skin is warm and dry.  Psychiatric: He has a normal mood and affect. His behavior is normal. Judgment normal.  Vitals reviewed.     BP 128/70 (BP Location: Right Arm, Patient Position: Sitting, Cuff Size: Normal)   Pulse 77   Temp 98.1 F (36.7 C) (Oral)   Ht 5\' 8"  (1.727 m)   Wt 193 lb (87.5 kg)   SpO2 96%   BMI 29.35 kg/m  Wt Readings from Last 3 Encounters:  08/16/18 193 lb (87.5 kg)    07/12/18 196 lb (88.9 kg)  06/04/18 201 lb (91.2 kg)       Assessment & Plan:  1. Annual physical exam - Discussed and encouraged healthy lifestyle choices- adequate sleep, regular exercise, stress management and healthy food choices.    2. Elevated LFTs - Hepatic Function Panel - Hepatitis B core antibody, IgM - Hepatitis B surface antigen - Hepatitis C antibody  3. Rash - he will try OTC antifungal cream, if no improvement, can refer to dermatology  4. Numbness of fingers of both hands - has been referred to neurology in past, patient never scheduled appointment - consider EMG/nerve conduction studies - Vitamin B12  5. Screening for prostate cancer - PSA  6. Screening for HIV without presence of risk factors - HIV Antibody (routine testing w rflx)  7. Screening for colon cancer - Ambulatory referral to Gastroenterology  8. Tobacco abuse - encouraged him to consider cessation or at least decrease amount  - follow up in 6 months  Olean Ree, FNP-BC  [facility] at [facility], [facility]  08/18/2018 6:06 PM

## 2018-08-17 LAB — HEPATIC FUNCTION PANEL
AG Ratio: 1.5 (calc) (ref 1.0–2.5)
ALBUMIN MSPROF: 4 g/dL (ref 3.6–5.1)
ALT: 45 U/L (ref 9–46)
AST: 51 U/L — AB (ref 10–35)
Alkaline phosphatase (APISO): 83 U/L (ref 40–115)
BILIRUBIN DIRECT: 0.2 mg/dL (ref 0.0–0.2)
Globulin: 2.7 g/dL (calc) (ref 1.9–3.7)
Indirect Bilirubin: 0.7 mg/dL (calc) (ref 0.2–1.2)
Total Bilirubin: 0.9 mg/dL (ref 0.2–1.2)
Total Protein: 6.7 g/dL (ref 6.1–8.1)

## 2018-08-17 LAB — VITAMIN B12: Vitamin B-12: 326 pg/mL (ref 200–1100)

## 2018-08-17 LAB — HIV ANTIBODY (ROUTINE TESTING W REFLEX): HIV: NONREACTIVE

## 2018-08-17 LAB — HEPATITIS B CORE ANTIBODY, IGM: Hep B C IgM: NONREACTIVE

## 2018-08-17 LAB — HEPATITIS B SURFACE ANTIGEN: HEP B S AG: NONREACTIVE

## 2018-08-17 LAB — HEPATITIS C ANTIBODY
HEP C AB: NONREACTIVE
SIGNAL TO CUT-OFF: 0.04 (ref <1.00)

## 2018-08-17 LAB — PSA: PSA: 0.8 ng/mL (ref <=4.0)

## 2018-08-18 ENCOUNTER — Encounter: Payer: Self-pay | Admitting: Family Medicine

## 2018-09-12 ENCOUNTER — Telehealth: Payer: Self-pay

## 2018-09-12 NOTE — Telephone Encounter (Signed)
Left message for patient to call us back.  

## 2018-09-16 ENCOUNTER — Encounter: Payer: Self-pay | Admitting: Family Medicine

## 2018-12-13 ENCOUNTER — Encounter: Payer: Self-pay | Admitting: Family Medicine

## 2018-12-13 ENCOUNTER — Ambulatory Visit: Payer: BLUE CROSS/BLUE SHIELD | Admitting: Family Medicine

## 2018-12-13 VITALS — BP 124/64 | HR 66 | Temp 98.2°F | Ht 68.0 in | Wt 192.0 lb

## 2018-12-13 DIAGNOSIS — R21 Rash and other nonspecific skin eruption: Secondary | ICD-10-CM | POA: Diagnosis not present

## 2018-12-13 DIAGNOSIS — Z1211 Encounter for screening for malignant neoplasm of colon: Secondary | ICD-10-CM

## 2018-12-13 MED ORDER — CEPHALEXIN 500 MG PO CAPS: 500.0000 mg | ORAL_CAPSULE | Freq: Three times a day (TID) | ORAL | 0 refills | Status: DC

## 2018-12-13 NOTE — Patient Instructions (Addendum)
I have put in a referral for you to see a dermatologist   For itch, you can apply calamine lotion or benadryl cream  Please call [facility] to schedule your colonoscopy- [phone]

## 2018-12-13 NOTE — Progress Notes (Signed)
Subjective:    Patient ID: Richard Skinner, male    DOB: 24-Oct-1961, 58 y.o.   MRN: [id number]  HPI This is a 58 yo male who presents today with rash on legs x 8 months. Was seen twice, first time 06/04/18 , given course of prednisone without improvement. Seen again 08/16/18, recommended antifungal treatment, no improvement. No known environmental triggers. Some improvement at times, but comes and goes. ?spreading to left elbow and lower back. Itchy, occasionally sore. Occasionally moisturizes.   Elevated LFTs- continues to only drink occassionally, down from 1 case a week.   Screening colonoscopy recommended at last visit and referral placed. Patient states he never got a call. Per GI, they were unable to contact him.   Past Medical History:  Diagnosis Date  . Arthritis   . History of chicken pox   . History of hepatitis    Past Surgical History:  Procedure Laterality Date  . BACK SURGERY     Microdiscectomy  . Carpel tunnel Right   . KNEE SURGERY Right    X2  . NECK SURGERY     Discectomy and fusion.  Marland Kitchen. VARICOCELE EXCISION     Family History  Problem Relation Age of Onset  . Healthy Mother   . Arthritis Father   . Diabetes Paternal Grandfather    Social History   Tobacco Use  . Smoking status: Current Every Day Smoker  . Smokeless tobacco: Never Used  Substance Use Topics  . Alcohol use: Yes    Alcohol/week: 24.0 standard drinks    Types: 24 Cans of beer per week  . Drug use: No      Review of Systems Per HPI    Objective:   Physical Exam Constitutional:      Appearance: Normal appearance. He is obese.  Eyes:     Conjunctiva/sclera: Conjunctivae normal.  Cardiovascular:     Rate and Rhythm: Normal rate.  Pulmonary:     Effort: Pulmonary effort is normal.  Skin:    General: Skin is warm and dry.     Findings: Rash present.     Comments: Bilateral lateral calves with large area of erythema, few scattered small areas scabbing, otherwise no palpable or  visible lesions. Small area flaking on upper gluteal cleft, mild erythema.   Neurological:     Mental Status: He is alert and oriented to person, place, and time.  Psychiatric:        Mood and Affect: Mood normal.        Behavior: Behavior normal.        Thought Content: Thought content normal.        Judgment: Judgment normal.       BP 124/64 (BP Location: Left Arm, Patient Position: Sitting, Cuff Size: Normal)   Pulse 66   Temp 98.2 F (36.8 C) (Oral)   Ht 5\' 8"  (1.727 m)   Wt 192 lb (87.1 kg)   SpO2 95%   BMI 29.19 kg/m  Wt Readings from Last 3 Encounters:  12/13/18 192 lb (87.1 kg)  08/16/18 193 lb (87.5 kg)  07/12/18 196 lb (88.9 kg)   BP Readings from Last 3 Encounters:  12/13/18 124/64  08/16/18 128/70  07/12/18 140/78       Assessment & Plan:  1. Rash - ongoing, worsening and spreading - will cover with antibiotic for possible secondary infection - encouraged him to avoid scratching, provided suggestions for itch relief - cephALEXin (KEFLEX) 500 MG capsule; Take 1 capsule (500  mg total) by mouth 3 (three) times daily.  Dispense: 21 capsule; Refill: 0 - Ambulatory referral to Dermatology  2. Screening for colon cancer - provided him with number to contact gastroenterology for scheduling  Olean Ree, FNP-BC  [facility] at [facility], [facility]  12/13/2018 9:22 AM

## 2018-12-13 NOTE — Progress Notes (Signed)
Subjective:    Patient ID: Richard Skinner, male    DOB: 24-Feb-1961, 58 y.o.   MRN: [id number]  HPI This is a 58 yo male being seen today for a rash on both legs that began June 2019, but started only on the left calf. Today it appears on both the left and right calf. He has been seen before for this issue and was treated with a prednisone pack and an antifungal cream. Patient stated it cleared some but never went away. He feels as though it is spreading to his left forearm and top of his gluteal fold.  Past Medical History:  Diagnosis Date  . Arthritis   . History of chicken pox   . History of hepatitis    Past Surgical History:  Procedure Laterality Date  . BACK SURGERY     Microdiscectomy  . Carpel tunnel Right   . KNEE SURGERY Right    X2  . NECK SURGERY     Discectomy and fusion.  Marland Kitchen VARICOCELE EXCISION     Family History  Problem Relation Age of Onset  . Healthy Mother   . Arthritis Father   . Diabetes Paternal Grandfather    Social History   Tobacco Use  . Smoking status: Current Every Day Smoker  . Smokeless tobacco: Never Used  Substance Use Topics  . Alcohol use: Yes    Alcohol/week: 24.0 standard drinks    Types: 24 Cans of beer per week  . Drug use: No      Review of Systems  Constitutional: Negative.   HENT: Negative.   Eyes: Negative.   Respiratory: Negative.   Cardiovascular: Negative.   Gastrointestinal: Negative.   Endocrine: Negative.   Genitourinary: Negative.   Musculoskeletal: Negative.   Skin: Positive for color change and rash.       L & R outer calf, L forearm, top of gluteal cleft.   Neurological: Negative.   Psychiatric/Behavioral: Negative.       per HPI Objective:   Physical Exam Vitals signs reviewed.  Constitutional:      Appearance: Normal appearance.  HENT:     Head: Normocephalic.  Skin:    General: Skin is warm and dry.     Findings: Erythema and rash present. Rash is urticarial.       Neurological:   Mental Status: He is alert and oriented to person, place, and time.  Psychiatric:        Attention and Perception: Attention and perception normal.        Mood and Affect: Mood and affect normal.        Speech: Speech normal.        Cognition and Memory: Cognition and memory normal.        Judgment: Judgment normal.    BP 124/64 (BP Location: Left Arm, Patient Position: Sitting, Cuff Size: Normal)   Pulse 66   Temp 98.2 F (36.8 C) (Oral)   Ht 5\' 8"  (1.727 m)   Wt 192 lb (87.1 kg)   SpO2 95%   BMI 29.19 kg/m   Filed Weights   12/13/18 0803  Weight: 192 lb (87.1 kg)          Assessment & Plan:  Rash - Plan: cephALEXin (KEFLEX) 500 MG capsule, Ambulatory referral to Dermatology   Patient Instructions  I have put in a referral for you to see a dermatologist   For itch, you can apply calamine lotion or benadryl cream  Please call [facility]  to schedule your colonoscopy- [phone]

## 2019-01-03 DIAGNOSIS — I872 Venous insufficiency (chronic) (peripheral): Secondary | ICD-10-CM | POA: Diagnosis not present

## 2019-01-03 DIAGNOSIS — L308 Other specified dermatitis: Secondary | ICD-10-CM | POA: Diagnosis not present

## 2019-09-22 ENCOUNTER — Encounter: Payer: Self-pay | Admitting: Family Medicine

## 2019-09-22 ENCOUNTER — Ambulatory Visit: Payer: BC Managed Care – PPO | Admitting: Family Medicine

## 2019-09-22 ENCOUNTER — Other Ambulatory Visit: Payer: Self-pay

## 2019-09-22 VITALS — BP 158/90 | HR 73 | Temp 99.0°F | Ht 68.0 in | Wt 199.0 lb

## 2019-09-22 DIAGNOSIS — R7989 Other specified abnormal findings of blood chemistry: Secondary | ICD-10-CM

## 2019-09-22 DIAGNOSIS — Z720 Tobacco use: Secondary | ICD-10-CM | POA: Diagnosis not present

## 2019-09-22 DIAGNOSIS — K420 Umbilical hernia with obstruction, without gangrene: Secondary | ICD-10-CM | POA: Diagnosis not present

## 2019-09-22 DIAGNOSIS — Z125 Encounter for screening for malignant neoplasm of prostate: Secondary | ICD-10-CM | POA: Diagnosis not present

## 2019-09-22 DIAGNOSIS — E782 Mixed hyperlipidemia: Secondary | ICD-10-CM

## 2019-09-22 DIAGNOSIS — I872 Venous insufficiency (chronic) (peripheral): Secondary | ICD-10-CM

## 2019-09-22 DIAGNOSIS — K429 Umbilical hernia without obstruction or gangrene: Secondary | ICD-10-CM | POA: Diagnosis not present

## 2019-09-22 NOTE — Patient Instructions (Signed)
Please arrive at [facility]. At 10 am tomorrow morning  You will get a call about circulation studies of your legs  I will notify you of lab results  Consider stopping smoking  Follow up in 1 month for your annual physical exam   Coping with Quitting Smoking  Quitting smoking is a physical and mental challenge. You will face cravings, withdrawal symptoms, and temptation. Before quitting, work with your health care provider to make a plan that can help you cope. Preparation can help you quit and keep you from giving in. How can I cope with cravings? Cravings usually last for 5-10 minutes. If you get through it, the craving will pass. Consider taking the following actions to help you cope with cravings:  Keep your mouth busy: ? Chew sugar-free gum. ? Suck on hard candies or a straw. ? Brush your teeth.  Keep your hands and body busy: ? Immediately change to a different activity when you feel a craving. ? Squeeze or play with a ball. ? Do an activity or a hobby, like making bead jewelry, practicing needlepoint, or working with wood. ? Mix up your normal routine. ? Take a short exercise break. Go for a quick walk or run up and down stairs. ? Spend time in public places where smoking is not allowed.  Focus on doing something kind or helpful for someone else.  Call a friend or family member to talk during a craving.  Join a support group.  Call a quit line, such as 1-800-QUIT-NOW.  Talk with your health care provider about medicines that might help you cope with cravings and make quitting easier for you. How can I deal with withdrawal symptoms? Your body may experience negative effects as it tries to get used to not having nicotine in the system. These effects are called withdrawal symptoms. They may include:  Feeling hungrier than normal.  Trouble concentrating.  Irritability.  Trouble sleeping.  Feeling depressed.  Restlessness and  agitation.  Craving a cigarette. To manage withdrawal symptoms:  Avoid places, people, and activities that trigger your cravings.  Remember why you want to quit.  Get plenty of sleep.  Avoid coffee and other caffeinated drinks. These may worsen some of your symptoms. How can I handle social situations? Social situations can be difficult when you are quitting smoking, especially in the first few weeks. To manage this, you can:  Avoid parties, bars, and other social situations where people might be smoking.  Avoid alcohol.  Leave right away if you have the urge to smoke.  Explain to your family and friends that you are quitting smoking. Ask for understanding and support.  Plan activities with friends or family where smoking is not an option. What are some ways I can cope with stress? Wanting to smoke may cause stress, and stress can make you want to smoke. Find ways to manage your stress. Relaxation techniques can help. For example:  Breathe slowly and deeply, in through your nose and out through your mouth.  Listen to soothing, relaxing music.  Talk with a family member or friend about your stress.  Light a candle.  Soak in a bath or take a shower.  Think about a peaceful place. What are some ways I can prevent weight gain? Be aware that many people gain weight after they quit smoking. However, not everyone does. To keep from gaining weight, have a plan in place before you quit and stick to the plan  after you quit. Your plan should include:  Having healthy snacks. When you have a craving, it may help to: ? Eat plain popcorn, crunchy carrots, celery, or other cut vegetables. ? Chew sugar-free gum.  Changing how you eat: ? Eat small portion sizes at meals. ? Eat 4-6 small meals throughout the day instead of 1-2 large meals a day. ? Be mindful when you eat. Do not watch television or do other things that might distract you as you eat.  Exercising regularly: ? Make time  to exercise each day. If you do not have time for a long workout, do short bouts of exercise for 5-10 minutes several times a day. ? Do some form of strengthening exercise, like weight lifting, and some form of aerobic exercise, like running or swimming.  Drinking plenty of water or other low-calorie or no-calorie drinks. Drink 6-8 glasses of water daily, or as much as instructed by your health care provider. Summary  Quitting smoking is a physical and mental challenge. You will face cravings, withdrawal symptoms, and temptation to smoke again. Preparation can help you as you go through these challenges.  You can cope with cravings by keeping your mouth busy (such as by chewing gum), keeping your body and hands busy, and making calls to family, friends, or a helpline for people who want to quit smoking.  You can cope with withdrawal symptoms by avoiding places where people smoke, avoiding drinks with caffeine, and getting plenty of rest.  Ask your health care provider about the different ways to prevent weight gain, avoid stress, and handle social situations. This information is not intended to replace advice given to you by your health care provider. Make sure you discuss any questions you have with your health care provider. Document Released: 10/20/2016 Document Revised: 10/05/2017 Document Reviewed: 10/20/2016 Elsevier Patient Education  2020 [employer].

## 2019-09-22 NOTE — Progress Notes (Addendum)
Subjective:    Patient ID: Richard Skinner, male    DOB: [birth year], 58 y.o.   MRN: [id number]  HPI Chief Complaint  Patient presents with  . Umbilical Hernia    Worsening umbilical hernia, redness approx 2"+ around belly button, hot to touch. Denies pain unless bending over. Denies, N/V/F. No decreased BMs. Last BM today, normal. Pt states that site only flared in the last 2 days.   . Circulatory Problem    Lower Left Leg, redness, irritation -- pt believes this is more of a circulation issue. Has taken Abx with no resolve. Not evaluated for DVT.   This is a 58 yo male who presents today for above cc.  Does heavy lifting to make foundry forms. Has noticed umbilical hernia for awhile, over last two days, has gotten firmer, larger and area redness around it. Unable to reduce. He denies nausea, vomiting, fever. He has been having normal bowel movements and had last BM today. No blood or mucus.   Has chronic stasis rash of bilateral legs, left worse than right. Was seen by dermatology who prescribed antibiotic and topical treatment with little relief of itching, redness.   Tobacco abuse- smokes 1 ppd down from 2.   ROS- denies chest pain, SOB, legs get "tired" with significant walking.   Past Medical History:  Diagnosis Date  . Arthritis   . History of chicken pox   . History of hepatitis    Past Surgical History:  Procedure Laterality Date  . BACK SURGERY     Microdiscectomy  . Carpel tunnel Right   . KNEE SURGERY Right    X2  . NECK SURGERY     Discectomy and fusion.  Marland Kitchen VARICOCELE EXCISION     Family History  Problem Relation Age of Onset  . Healthy Mother   . Arthritis Father   . Diabetes Paternal Grandfather    Social History   Tobacco Use  . Smoking status: Current Every Day Smoker  . Smokeless tobacco: Never Used  Substance Use Topics  . Alcohol use: Yes    Alcohol/week: 24.0 standard drinks    Types: 24 Cans of beer per week  . Drug use: No      Review  of Systems Per HPI    Objective:   Physical Exam Constitutional:      General: He is not in acute distress.    Appearance: Normal appearance. He is obese. He is not ill-appearing, toxic-appearing or diaphoretic.  HENT:     Head: Normocephalic and atraumatic.  Eyes:     Conjunctiva/sclera: Conjunctivae normal.  Neck:     Musculoskeletal: Normal range of motion.  Cardiovascular:     Rate and Rhythm: Normal rate and regular rhythm.     Heart sounds: Normal heart sounds.  Pulmonary:     Effort: Pulmonary effort is normal.     Breath sounds: Normal breath sounds.  Abdominal:     General: Bowel sounds are normal. There is no distension.     Palpations: Abdomen is soft.     Hernia: A hernia is present. Hernia is present in the umbilical area.     Comments: Slightly protruding umbilical hernia, mild tenderness. Surrounding area mild erythema. Unable to reduce.   Skin:    General: Skin is warm and dry.     Comments: Bilateral lower extremities with hyperpigmentation, L>R, scattered areas healing excoriations. Some dryness, flaking.   Neurological:     Mental Status: He is alert and oriented  to person, place, and time.  Psychiatric:        Mood and Affect: Mood normal.        Behavior: Behavior normal.        Thought Content: Thought content normal.        Judgment: Judgment normal.     Comments: Appears mildly anxious.        BP (!) 158/90 (BP Location: Left Arm, Patient Position: Sitting, Cuff Size: Normal)   Pulse 73   Temp 99 F (37.2 C) (Temporal)   Ht 5\' 8"  (1.727 m)   Wt 199 lb (90.3 kg)   SpO2 97%   BMI 30.26 kg/m  Wt Readings from Last 3 Encounters:  09/22/19 199 lb (90.3 kg)  12/13/18 192 lb (87.1 kg)  08/16/18 193 lb (87.5 kg)   BP Readings from Last 3 Encounters:  09/22/19 (!) 158/90  12/13/18 124/64  08/16/18 128/70   EKG- Normal sinus rhythm, rate 65, PR 122, QT 388    Assessment & Plan:  1. Umbilical hernia without obstruction and without gangrene  - patient scheduled at [facility] tomorrow morning, ER precautions reviewed - EKG 12-Lead - Ambulatory referral to General Surgery  2. Venous stasis dermatitis of both lower extremities - discussed importance of managing his lipids and smoking cessation - 10/16/18 Venous Img Lower Bilateral; Future - Comprehensive metabolic panel - CBC with Differential - Lipid Panel  3. Elevated LFTs - encouraged him to abstain from alcohol - Comprehensive metabolic panel  4. Tobacco abuse - discussed importance of smoking cessation and provided written information - CBC with Differential  5. Mixed hyperlipidemia - had elevated lipids and was prescribed statin. He never started.  - Comprehensive metabolic panel - Lipid Panel  6. Screening for prostate cancer - PSA  - follow up in 1 month  [place], FNP-BC  [facility], [facility]  09/23/2019 9:10 AM   10/10/19- Called and spoke with patient who reports conflict with Worker's Comp Claim in regards to his umbilical hernia/surgical repair. Per notes from 07/12/18- hernia is documented. According to the patient, he notified his employer the next day of his hernia.

## 2019-09-23 ENCOUNTER — Other Ambulatory Visit: Payer: Self-pay | Admitting: Student

## 2019-09-23 ENCOUNTER — Encounter: Payer: Self-pay | Admitting: Family Medicine

## 2019-09-23 ENCOUNTER — Ambulatory Visit: Payer: Self-pay | Admitting: Surgery

## 2019-09-23 ENCOUNTER — Other Ambulatory Visit (HOSPITAL_COMMUNITY)
Admission: RE | Admit: 2019-09-23 | Discharge: 2019-09-23 | Disposition: A | Discharge status: Home / Self Care | Payer: BC Managed Care – PPO | Source: Ambulatory Visit | Attending: Surgery | Admitting: Surgery

## 2019-09-23 DIAGNOSIS — I872 Venous insufficiency (chronic) (peripheral): Secondary | ICD-10-CM | POA: Insufficient documentation

## 2019-09-23 DIAGNOSIS — E782 Mixed hyperlipidemia: Secondary | ICD-10-CM | POA: Insufficient documentation

## 2019-09-23 DIAGNOSIS — Z20828 Contact with and (suspected) exposure to other viral communicable diseases: Secondary | ICD-10-CM | POA: Diagnosis not present

## 2019-09-23 DIAGNOSIS — Z01812 Encounter for preprocedural laboratory examination: Secondary | ICD-10-CM | POA: Diagnosis not present

## 2019-09-23 DIAGNOSIS — R7989 Other specified abnormal findings of blood chemistry: Secondary | ICD-10-CM | POA: Insufficient documentation

## 2019-09-23 DIAGNOSIS — K429 Umbilical hernia without obstruction or gangrene: Secondary | ICD-10-CM | POA: Insufficient documentation

## 2019-09-23 LAB — COMPREHENSIVE METABOLIC PANEL
ALT: 30 U/L (ref 0–53)
AST: 36 U/L (ref 0–37)
Albumin: 4.2 g/dL (ref 3.5–5.2)
Alkaline Phosphatase: 89 U/L (ref 39–117)
BUN: 14 mg/dL (ref 6–23)
CO2: 27 mEq/L (ref 19–32)
Calcium: 9.4 mg/dL (ref 8.4–10.5)
Chloride: 103 mEq/L (ref 96–112)
Creatinine, Ser: 0.84 mg/dL (ref 0.40–1.50)
GFR: 93.16 mL/min (ref >60.00)
Glucose, Bld: 106 mg/dL — ABNORMAL HIGH (ref 70–99)
Potassium: 4.5 mEq/L (ref 3.5–5.1)
Sodium: 137 mEq/L (ref 135–145)
Total Bilirubin: 0.7 mg/dL (ref 0.2–1.2)
Total Protein: 7 g/dL (ref 6.0–8.3)

## 2019-09-23 LAB — LIPID PANEL
Cholesterol: 210 mg/dL — ABNORMAL HIGH (ref 0–200)
HDL: 36.6 mg/dL — ABNORMAL LOW (ref >39.00)
LDL Cholesterol: 135 mg/dL — ABNORMAL HIGH (ref 0–99)
NonHDL: 173.87
Total CHOL/HDL Ratio: 6
Triglycerides: 194 mg/dL — ABNORMAL HIGH (ref 0.0–149.0)
VLDL: 38.8 mg/dL (ref 0.0–40.0)

## 2019-09-23 LAB — CBC WITH DIFFERENTIAL/PLATELET
Basophils Absolute: 0.1 10*3/uL (ref 0.0–0.1)
Basophils Relative: 1.5 % (ref 0.0–3.0)
Eosinophils Absolute: 0.4 10*3/uL (ref 0.0–0.7)
Eosinophils Relative: 4.7 % (ref 0.0–5.0)
HCT: 48.5 % (ref 39.0–52.0)
Hemoglobin: 16.5 g/dL (ref 13.0–17.0)
Lymphocytes Relative: 23.4 % (ref 12.0–46.0)
Lymphs Abs: 1.8 10*3/uL (ref 0.7–4.0)
MCHC: 34.1 g/dL (ref 30.0–36.0)
MCV: 98.8 fl (ref 78.0–100.0)
Monocytes Absolute: 1 10*3/uL (ref 0.1–1.0)
Monocytes Relative: 13.3 % — ABNORMAL HIGH (ref 3.0–12.0)
Neutro Abs: 4.4 10*3/uL (ref 1.4–7.7)
Neutrophils Relative %: 57.1 % (ref 43.0–77.0)
Platelets: 145 10*3/uL — ABNORMAL LOW (ref 150.0–400.0)
RBC: 4.92 Mil/uL (ref 4.22–5.81)
RDW: 14.1 % (ref 11.5–15.5)
WBC: 7.6 10*3/uL (ref 4.0–10.5)

## 2019-09-23 LAB — PSA: PSA: 3.68 ng/mL (ref 0.10–4.00)

## 2019-09-23 LAB — SARS CORONAVIRUS 2 (TAT 6-24 HRS): SARS Coronavirus 2: NEGATIVE

## 2019-09-23 NOTE — H&P (View-Only) (Signed)
Richard Skinner Documented: 09/23/2019 10:50 AM Location: [facility] Patient #: [id number] DOB: 04-Apr-1961 Married / Language: English / Race: White Male   History of Present Illness  The patient is a 58 year old male who presents with an umbilical hernia. He was seen by his PCP yesterday, 04/54/09, for umbilical pain. He states he has had an umbilical hernia which has been reducible for about 1 year. He is usually able to push the hernia back into place, but over the past few days he developed increased tenderness, redness, and firmness in the area. He does not complain of severe pain, but describes it as "sore." Denies fevers, nausea, or vomiting. He has had several normal bowel movements over the last 2 days. He does have a feeling of fullness and tightness in his lower abdomen.  He is fairly healthy.  Only medication is Advil for arthritis. He does not report any major pulmonary or cardiac issues. He is not blood thinners. He smokes a pack a day. He does not need to use an inhaler and denies history of COPD. He drinks 3-4 beers a day. He states about 23 years ago he had a laparoscopic repair of a varicocele, likely at the site of his umbilical hernia. Denies any other abdominal surgeries. He denies being exposed to anyone COVID positive and denies concerning symptoms.   Past Surgical History Knee Surgery  Right.  Allergies amLODIPine-Atorvastatin *CARDIOVASCULAR AGENTS - MISC.*  Allergies Reconciled   Medication History Advil (200MG  Capsule, Oral) Active. Medications Reconciled  Social History  Alcohol use  Moderate alcohol use. Caffeine use  Tea. Tobacco use  Current every day smoker.  Other Problems Arthritis    Review of Systems  General Not Present- Appetite Loss, Chills, Fatigue, Fever, Night Sweats, Weight Gain and Weight Loss. Skin Not Present- Change in Wart/Mole, Dryness, Hives, Jaundice, New Lesions, Non-Healing Wounds, Rash and  Ulcer. HEENT Not Present- Earache, Hearing Loss, Hoarseness, Nose Bleed, Oral Ulcers, Ringing in the Ears, Seasonal Allergies, Sinus Pain, Sore Throat, Visual Disturbances, Wears glasses/contact lenses and Yellow Eyes. Respiratory Not Present- Bloody sputum, Chronic Cough, Difficulty Breathing, Snoring and Wheezing. Breast Not Present- Breast Mass, Breast Pain, Nipple Discharge and Skin Changes. Cardiovascular Not Present- Chest Pain, Difficulty Breathing Lying Down, Leg Cramps, Palpitations, Rapid Heart Rate, Shortness of Breath and Swelling of Extremities. Gastrointestinal Present- Abdominal Pain and Gets full quickly at meals. Not Present- Bloating, Bloody Stool, Change in Bowel Habits, Chronic diarrhea, Constipation, Difficulty Swallowing, Excessive gas, Hemorrhoids, Indigestion, Nausea, Rectal Pain and Vomiting. Male Genitourinary Not Present- Blood in Urine, Change in Urinary Stream, Frequency, Impotence, Nocturia, Painful Urination, Urgency and Urine Leakage. Neurological Not Present- Decreased Memory, Fainting, Headaches, Numbness, Seizures, Tingling, Tremor, Trouble walking and Weakness. Psychiatric Not Present- Anxiety, Bipolar, Change in Sleep Pattern, Depression, Fearful and Frequent crying. Endocrine Not Present- Cold Intolerance, Excessive Hunger, Hair Changes, Heat Intolerance, Hot flashes and New Diabetes. Hematology Not Present- Blood Thinners, Easy Bruising, Excessive bleeding, Gland problems, HIV and Persistent Infections.  Vitals  09/23/2019 11:01 AM Weight: 199.4 lb Height: 70in Body Surface Area: 2.08 m Body Mass Index: 28.61 kg/m  Temp.: 98.44F  Pulse: 84 (Regular)  BP: 140/75 (Sitting, Left Arm, Standard)   Physical Exam GENERAL: Well-developed, well nourished male in no acute distress  EYES: No scleral icterus Pupils equal, lids normal  EXTERNAL EARS: Intact, no masses or lesions EXTERNAL NOSE: Intact, no masses or lesions MOUTH: Lips - no  lesions Dentition - normal for age  RESPIRATORY: Normal  effort, no use of accessory muscles  MUSCULOSKELETAL: Normal gait Grossly normal ROM upper extremities Grossly normal ROM lower extremities  SKIN: Warm and dry Not diaphoretic  PSYCHIATRIC: Normal judgement and insight Normal mood and affect Alert, oriented x 3  ABDOMEN: Umbilicus with erythema, 3 cm x 2 cm, and spreading faintly to the surrounding tissue There is some firmness Unable to reduce No fluctuance or necrosis   Assessment & Plan UMBILICAL HERNIA, INCARCERATED (K42.0) He is presenting to urgent office with an incarcerated umbilical hernia, likely omental fat. Dr. Derrell Lolling also evaluated the patient.  At this time, we do not believe he bowel is involved, given he has had no signs of obstruction.  Considering that he does not appear ill and is not in much pain, we recommended repairing the hernia outpatient on an urgent basis. We will get Covid testing done and we'll call him with the plan regarding the surgery in a few days.  Dr. Daphine Deutscher, LDOW, is aware and has completed pre-op orders.   Signed by Tsosie Billing, PA C (09/23/2019 3:42 PM)

## 2019-09-23 NOTE — H&P (Signed)
Richard Skinner Documented: 09/23/2019 10:50 AM Location: [facility] Patient #: [id number] DOB: 04-Apr-1961 Married / Language: English / Race: White Male   History of Present Illness  The patient is a 58 year old male who presents with an umbilical hernia. He was seen by his PCP yesterday, 04/54/09, for umbilical pain. He states he has had an umbilical hernia which has been reducible for about 1 year. He is usually able to push the hernia back into place, but over the past few days he developed increased tenderness, redness, and firmness in the area. He does not complain of severe pain, but describes it as "sore." Denies fevers, nausea, or vomiting. He has had several normal bowel movements over the last 2 days. He does have a feeling of fullness and tightness in his lower abdomen.  He is fairly healthy.  Only medication is Advil for arthritis. He does not report any major pulmonary or cardiac issues. He is not blood thinners. He smokes a pack a day. He does not need to use an inhaler and denies history of COPD. He drinks 3-4 beers a day. He states about 23 years ago he had a laparoscopic repair of a varicocele, likely at the site of his umbilical hernia. Denies any other abdominal surgeries. He denies being exposed to anyone COVID positive and denies concerning symptoms.   Past Surgical History Knee Surgery  Right.  Allergies amLODIPine-Atorvastatin *CARDIOVASCULAR AGENTS - MISC.*  Allergies Reconciled   Medication History Advil (200MG  Capsule, Oral) Active. Medications Reconciled  Social History  Alcohol use  Moderate alcohol use. Caffeine use  Tea. Tobacco use  Current every day smoker.  Other Problems Arthritis    Review of Systems  General Not Present- Appetite Loss, Chills, Fatigue, Fever, Night Sweats, Weight Gain and Weight Loss. Skin Not Present- Change in Wart/Mole, Dryness, Hives, Jaundice, New Lesions, Non-Healing Wounds, Rash and  Ulcer. HEENT Not Present- Earache, Hearing Loss, Hoarseness, Nose Bleed, Oral Ulcers, Ringing in the Ears, Seasonal Allergies, Sinus Pain, Sore Throat, Visual Disturbances, Wears glasses/contact lenses and Yellow Eyes. Respiratory Not Present- Bloody sputum, Chronic Cough, Difficulty Breathing, Snoring and Wheezing. Breast Not Present- Breast Mass, Breast Pain, Nipple Discharge and Skin Changes. Cardiovascular Not Present- Chest Pain, Difficulty Breathing Lying Down, Leg Cramps, Palpitations, Rapid Heart Rate, Shortness of Breath and Swelling of Extremities. Gastrointestinal Present- Abdominal Pain and Gets full quickly at meals. Not Present- Bloating, Bloody Stool, Change in Bowel Habits, Chronic diarrhea, Constipation, Difficulty Swallowing, Excessive gas, Hemorrhoids, Indigestion, Nausea, Rectal Pain and Vomiting. Male Genitourinary Not Present- Blood in Urine, Change in Urinary Stream, Frequency, Impotence, Nocturia, Painful Urination, Urgency and Urine Leakage. Neurological Not Present- Decreased Memory, Fainting, Headaches, Numbness, Seizures, Tingling, Tremor, Trouble walking and Weakness. Psychiatric Not Present- Anxiety, Bipolar, Change in Sleep Pattern, Depression, Fearful and Frequent crying. Endocrine Not Present- Cold Intolerance, Excessive Hunger, Hair Changes, Heat Intolerance, Hot flashes and New Diabetes. Hematology Not Present- Blood Thinners, Easy Bruising, Excessive bleeding, Gland problems, HIV and Persistent Infections.  Vitals  09/23/2019 11:01 AM Weight: 199.4 lb Height: 70in Body Surface Area: 2.08 m Body Mass Index: 28.61 kg/m  Temp.: 98.44F  Pulse: 84 (Regular)  BP: 140/75 (Sitting, Left Arm, Standard)   Physical Exam GENERAL: Well-developed, well nourished male in no acute distress  EYES: No scleral icterus Pupils equal, lids normal  EXTERNAL EARS: Intact, no masses or lesions EXTERNAL NOSE: Intact, no masses or lesions MOUTH: Lips - no  lesions Dentition - normal for age  RESPIRATORY: Normal  effort, no use of accessory muscles  MUSCULOSKELETAL: Normal gait Grossly normal ROM upper extremities Grossly normal ROM lower extremities  SKIN: Warm and dry Not diaphoretic  PSYCHIATRIC: Normal judgement and insight Normal mood and affect Alert, oriented x 3  ABDOMEN: Umbilicus with erythema, 3 cm x 2 cm, and spreading faintly to the surrounding tissue There is some firmness Unable to reduce No fluctuance or necrosis   Assessment & Plan UMBILICAL HERNIA, INCARCERATED (K42.0) He is presenting to urgent office with an incarcerated umbilical hernia, likely omental fat. Dr. Derrell Lolling also evaluated the patient.  At this time, we do not believe he bowel is involved, given he has had no signs of obstruction.  Considering that he does not appear ill and is not in much pain, we recommended repairing the hernia outpatient on an urgent basis. We will get Covid testing done and we'll call him with the plan regarding the surgery in a few days.  Dr. Daphine Deutscher, LDOW, is aware and has completed pre-op orders.   Signed by Tsosie Billing, PA C (09/23/2019 3:42 PM)

## 2019-09-24 ENCOUNTER — Encounter (HOSPITAL_COMMUNITY): Payer: Self-pay

## 2019-09-24 ENCOUNTER — Other Ambulatory Visit: Payer: Self-pay

## 2019-09-24 ENCOUNTER — Encounter (HOSPITAL_COMMUNITY)
Admission: RE | Admit: 2019-09-24 | Discharge: 2019-09-24 | Disposition: A | Discharge status: Home / Self Care | Payer: BC Managed Care – PPO | Source: Ambulatory Visit | Attending: Surgery | Admitting: Surgery

## 2019-09-24 DIAGNOSIS — Z01818 Encounter for other preprocedural examination: Secondary | ICD-10-CM | POA: Diagnosis not present

## 2019-09-24 MED ORDER — BUPIVACAINE LIPOSOME 1.3 % IJ SUSP: 20.0000 mL | Freq: Once | INTRAMUSCULAR | Status: DC

## 2019-09-24 MED ORDER — ROSUVASTATIN CALCIUM 10 MG PO TABS: 10.0000 mg | ORAL_TABLET | Freq: Every day | ORAL | 5 refills | Status: AC

## 2019-09-24 NOTE — Patient Instructions (Signed)
DUE TO COVID-19 ONLY ONE VISITOR IS ALLOWED TO COME WITH YOU AND STAY IN THE WAITING ROOM ONLY DURING PRE OP AND PROCEDURE DAY OF SURGERY. THE 1 VISITOR MAY VISIT WITH YOU AFTER SURGERY IN YOUR PRIVATE ROOM DURING VISITING HOURS ONLY!  YOU NEED TO HAVE A COVID 19 TEST ON_______ @_______ , THIS TEST MUST BE DONE BEFORE SURGERY, COME  [place], [place] , [id number].  ([place]) ONCE YOUR COVID TEST IS COMPLETED, PLEASE BEGIN THE QUARANTINE INSTRUCTIONS AS OUTLINED IN YOUR HANDOUT.                Raye Sorrow   Your procedure is scheduled on: 09/25/2019   Report to [facility] Main  Entrance   Report to admitting at  0730 AM     Call this number if you have problems the morning of surgery [phone]    Remember: Do not eat food or drink liquids :After Midnight. BRUSH YOUR TEETH MORNING OF SURGERY AND RINSE YOUR MOUTH OUT, NO CHEWING GUM CANDY OR MINTS.     Take these medicines the morning of surgery with A SIP OF WATER: NONE                                 You may not have any metal on your body including hair pins and              piercings  Do not wear jewelry, make-up, lotions, powders or perfumes, deodorant             Do not wear nail polish on your fingernails.  Do not shave  48 hours prior to surgery.              Men may shave face and neck.   Do not bring valuables to the hospital. [place].  Contacts, dentures or bridgework may not be worn into surgery.  Leave suitcase in the car. After surgery it may be brought to your room.     Patients discharged the day of surgery will not be allowed to drive home. IF YOU ARE HAVING SURGERY AND GOING HOME THE SAME DAY, YOU MUST HAVE AN ADULT TO DRIVE YOU HOME AND BE WITH YOU FOR 24 HOURS. YOU MAY GO HOME BY TAXI OR UBER OR ORTHERWISE, BUT AN ADULT MUST ACCOMPANY YOU HOME AND STAY WITH YOU FOR 24 HOURS.  Name and phone number of your  driver:  Special Instructions: N/A              Please read over the following fact sheets you were given: _____________________________________________________________________  [facility] - Preparing for Surgery Before surgery, you can play an important role.  Because skin is not sterile, your skin needs to be as free of germs as possible.  You can reduce the number of germs on your skin by washing with CHG (chlorahexidine gluconate) soap before surgery.  CHG is an antiseptic cleaner which kills germs and bonds with the skin to continue killing germs even after washing. Please DO NOT use if you have an allergy to CHG or antibacterial soaps.  If your skin becomes reddened/irritated stop using the CHG and inform your nurse when you arrive at Short Stay. Do not shave (including legs and underarms) for at least 48 hours prior  to the first CHG shower.  You may shave your face/neck.  Please follow these instructions carefully:  1.  Shower with CHG Soap the night before surgery and the  morning of surgery.  2.  If you choose to wash your hair, wash your hair first as usual with your normal  shampoo.  3.  After you shampoo, rinse your hair and body thoroughly to remove the shampoo.                             4.  Use CHG as you would any other liquid soap.  You can apply chg directly to the skin and wash.  Gently with a scrungie or clean washcloth.  5.  Apply the CHG Soap to your body ONLY FROM THE NECK DOWN.   Do   not use on face/ open                           Wound or open sores. Avoid contact with eyes, ears mouth and   genitals (private parts).                       Wash face,  Genitals (private parts) with your normal soap.             6.  Wash thoroughly, paying special attention to the area where your    surgery  will be performed.  7.  Thoroughly rinse your body with warm water from the neck down.  8.  DO NOT shower/wash with your normal soap after using and rinsing off the CHG Soap.                 9.  Pat yourself dry with a clean towel.            10.  Wear clean pajamas.            11.  Place clean sheets on your bed the night of your first shower and do not  sleep with pets. Day of Surgery : Do not apply any lotions/deodorants the morning of surgery.  Please wear clean clothes to the hospital/surgery center.  FAILURE TO FOLLOW THESE INSTRUCTIONS MAY RESULT IN THE CANCELLATION OF YOUR SURGERY  PATIENT SIGNATURE_________________________________  NURSE SIGNATURE__________________________________  ________________________________________________________________________

## 2019-09-24 NOTE — Progress Notes (Signed)
PCP - Clarene Reamer, NP Cardiologist - n/a Lab work completed and current in [place] dated 09/22/2019. Chest x-ray - n/a EKG - 09/22/2019 Stress Test -  ECHO -  Cardiac Cath -   Sleep Study - n/a CPAP -   Fasting Blood Sugar - n/a Checks Blood Sugar _____ times a day  Blood Thinner Instructions:NONE Aspirin Instructions: Last Dose:  Anesthesia review: No review needed.  Patient denies shortness of breath, fever, cough and chest pain at PAT appointment   Patient verbalized understanding of instructions that were given to them at the PAT appointment. Patient was also instructed that they will need to review over the PAT instructions again at home before surgery.

## 2019-09-24 NOTE — Addendum Note (Signed)
Addended by: Clarene Reamer B on: 09/24/2019 04:49 PM   Modules accepted: Orders

## 2019-09-25 ENCOUNTER — Encounter (HOSPITAL_COMMUNITY)
Admission: RE | Disposition: A | Discharge status: Home / Self Care | Payer: Self-pay | Source: Other Acute Inpatient Hospital | Attending: Surgery

## 2019-09-25 ENCOUNTER — Ambulatory Visit (HOSPITAL_COMMUNITY): Payer: BC Managed Care – PPO | Admitting: Anesthesiology

## 2019-09-25 ENCOUNTER — Ambulatory Visit (HOSPITAL_COMMUNITY)
Admission: RE | Admit: 2019-09-25 | Discharge: 2019-09-25 | Disposition: A | Discharge status: Home / Self Care | Payer: BC Managed Care – PPO | Source: Other Acute Inpatient Hospital | Attending: Surgery | Admitting: Surgery

## 2019-09-25 DIAGNOSIS — M1990 Unspecified osteoarthritis, unspecified site: Secondary | ICD-10-CM | POA: Insufficient documentation

## 2019-09-25 DIAGNOSIS — K420 Umbilical hernia with obstruction, without gangrene: Secondary | ICD-10-CM | POA: Insufficient documentation

## 2019-09-25 DIAGNOSIS — F17210 Nicotine dependence, cigarettes, uncomplicated: Secondary | ICD-10-CM | POA: Insufficient documentation

## 2019-09-25 DIAGNOSIS — Z791 Long term (current) use of non-steroidal anti-inflammatories (NSAID): Secondary | ICD-10-CM | POA: Diagnosis not present

## 2019-09-25 HISTORY — PX: UMBILICAL HERNIA REPAIR: SHX196

## 2019-09-25 SURGERY — REPAIR, HERNIA, UMBILICAL, LAPAROSCOPIC
Anesthesia: General | Site: Abdomen

## 2019-09-25 MED ORDER — ONDANSETRON HCL 4 MG/2ML IJ SOLN
INTRAMUSCULAR | Status: AC
  Filled 2019-09-25: qty 2

## 2019-09-25 MED ORDER — BUPIVACAINE LIPOSOME 1.3 % IJ SUSP
INTRAMUSCULAR | Status: DC | PRN
  Administered 2019-09-25: 20 mL

## 2019-09-25 MED ORDER — LIDOCAINE HCL (CARDIAC) PF 100 MG/5ML IV SOSY
PREFILLED_SYRINGE | INTRAVENOUS | Status: DC | PRN
  Administered 2019-09-25: 100 mg via INTRAVENOUS

## 2019-09-25 MED ORDER — PROPOFOL 10 MG/ML IV BOLUS
INTRAVENOUS | Status: DC | PRN
  Administered 2019-09-25: 140 mg via INTRAVENOUS

## 2019-09-25 MED ORDER — HEPARIN SODIUM (PORCINE) 5000 UNIT/ML IJ SOLN
5000.0000 [IU] | Freq: Once | INTRAMUSCULAR | Status: AC
  Administered 2019-09-25: 5000 [IU] via SUBCUTANEOUS
  Filled 2019-09-25: qty 1

## 2019-09-25 MED ORDER — ROCURONIUM BROMIDE 100 MG/10ML IV SOLN
INTRAVENOUS | Status: DC | PRN
  Administered 2019-09-25: 10 mg via INTRAVENOUS
  Administered 2019-09-25: 50 mg via INTRAVENOUS

## 2019-09-25 MED ORDER — DEXAMETHASONE SODIUM PHOSPHATE 10 MG/ML IJ SOLN
INTRAMUSCULAR | Status: AC
  Filled 2019-09-25: qty 1

## 2019-09-25 MED ORDER — MIDAZOLAM HCL 5 MG/5ML IJ SOLN
INTRAMUSCULAR | Status: DC | PRN
  Administered 2019-09-25: 2 mg via INTRAVENOUS

## 2019-09-25 MED ORDER — SUGAMMADEX SODIUM 200 MG/2ML IV SOLN
INTRAVENOUS | Status: DC | PRN
  Administered 2019-09-25: 200 mg via INTRAVENOUS

## 2019-09-25 MED ORDER — HYDROCODONE-ACETAMINOPHEN 5-325 MG PO TABS: 1.0000 | ORAL_TABLET | Freq: Four times a day (QID) | ORAL | 0 refills | Status: DC | PRN

## 2019-09-25 MED ORDER — LABETALOL HCL 5 MG/ML IV SOLN
INTRAVENOUS | Status: AC
  Filled 2019-09-25: qty 4

## 2019-09-25 MED ORDER — CEFAZOLIN SODIUM-DEXTROSE 2-4 GM/100ML-% IV SOLN
2.0000 g | INTRAVENOUS | Status: AC
  Administered 2019-09-25: 2 g via INTRAVENOUS
  Filled 2019-09-25: qty 100

## 2019-09-25 MED ORDER — SUGAMMADEX SODIUM 500 MG/5ML IV SOLN
INTRAVENOUS | Status: AC
  Filled 2019-09-25: qty 5

## 2019-09-25 MED ORDER — PHENYLEPHRINE HCL (PRESSORS) 10 MG/ML IV SOLN
INTRAVENOUS | Status: DC | PRN
  Administered 2019-09-25: 40 ug via INTRAVENOUS

## 2019-09-25 MED ORDER — 0.9 % SODIUM CHLORIDE (POUR BTL) OPTIME
TOPICAL | Status: DC | PRN
  Administered 2019-09-25: 1000 mL

## 2019-09-25 MED ORDER — CHLORHEXIDINE GLUCONATE CLOTH 2 % EX PADS: 6.0000 | MEDICATED_PAD | Freq: Once | CUTANEOUS | Status: DC

## 2019-09-25 MED ORDER — DEXAMETHASONE SODIUM PHOSPHATE 10 MG/ML IJ SOLN
INTRAMUSCULAR | Status: DC | PRN
  Administered 2019-09-25: 10 mg via INTRAVENOUS

## 2019-09-25 MED ORDER — ONDANSETRON HCL 4 MG/2ML IJ SOLN
INTRAMUSCULAR | Status: DC | PRN
  Administered 2019-09-25: 4 mg via INTRAVENOUS

## 2019-09-25 MED ORDER — ALBUMIN HUMAN 5 % IV SOLN
INTRAVENOUS | Status: AC
  Filled 2019-09-25: qty 250

## 2019-09-25 MED ORDER — FENTANYL CITRATE (PF) 100 MCG/2ML IJ SOLN: 25.0000 ug | INTRAMUSCULAR | Status: DC | PRN

## 2019-09-25 MED ORDER — FENTANYL CITRATE (PF) 250 MCG/5ML IJ SOLN
INTRAMUSCULAR | Status: AC
  Filled 2019-09-25: qty 5

## 2019-09-25 MED ORDER — MEPERIDINE HCL 50 MG/ML IJ SOLN: 6.2500 mg | INTRAMUSCULAR | Status: DC | PRN

## 2019-09-25 MED ORDER — PROPOFOL 10 MG/ML IV BOLUS
INTRAVENOUS | Status: AC
  Filled 2019-09-25: qty 20

## 2019-09-25 MED ORDER — BUPIVACAINE LIPOSOME 1.3 % IJ SUSP
20.0000 mL | INTRAMUSCULAR | Status: DC
  Filled 2019-09-25 (×2): qty 20

## 2019-09-25 MED ORDER — PROMETHAZINE HCL 25 MG/ML IJ SOLN: 6.2500 mg | INTRAMUSCULAR | Status: DC | PRN

## 2019-09-25 MED ORDER — FENTANYL CITRATE (PF) 100 MCG/2ML IJ SOLN
INTRAMUSCULAR | Status: DC | PRN
  Administered 2019-09-25 (×3): 50 ug via INTRAVENOUS
  Administered 2019-09-25: 100 ug via INTRAVENOUS

## 2019-09-25 MED ORDER — MIDAZOLAM HCL 2 MG/2ML IJ SOLN
INTRAMUSCULAR | Status: AC
  Filled 2019-09-25: qty 2

## 2019-09-25 MED ORDER — LACTATED RINGERS IV SOLN
INTRAVENOUS | Status: DC
  Administered 2019-09-25: 11:00:00 via INTRAVENOUS

## 2019-09-25 MED ORDER — ROCURONIUM BROMIDE 10 MG/ML (PF) SYRINGE
PREFILLED_SYRINGE | INTRAVENOUS | Status: AC
  Filled 2019-09-25: qty 10

## 2019-09-25 MED ORDER — ACETAMINOPHEN 500 MG PO TABS
1000.0000 mg | ORAL_TABLET | ORAL | Status: AC
  Administered 2019-09-25: 1000 mg via ORAL
  Filled 2019-09-25: qty 2

## 2019-09-25 SURGICAL SUPPLY — 42 items
BINDER ABDOMINAL 12 ML 46-62 (SOFTGOODS) IMPLANT
CABLE HIGH FREQUENCY MONO STRZ (ELECTRODE) ×2 IMPLANT
COVER SURGICAL LIGHT HANDLE (MISCELLANEOUS) ×2 IMPLANT
COVER WAND RF STERILE (DRAPES) IMPLANT
DECANTER SPIKE VIAL GLASS SM (MISCELLANEOUS) IMPLANT
DERMABOND ADVANCED (GAUZE/BANDAGES/DRESSINGS) ×2
DERMABOND ADVANCED .7 DNX12 (GAUZE/BANDAGES/DRESSINGS) ×2 IMPLANT
DEVICE SECURE STRAP 25 ABSORB (INSTRUMENTS) ×2 IMPLANT
DEVICE TROCAR PUNCTURE CLOSURE (ENDOMECHANICALS) IMPLANT
DISSECTOR BLUNT TIP ENDO 5MM (MISCELLANEOUS) IMPLANT
DRAIN CHANNEL 19F RND (DRAIN) IMPLANT
ELECT PENCIL ROCKER SW 15FT (MISCELLANEOUS) ×2 IMPLANT
ELECT REM PT RETURN 15FT ADLT (MISCELLANEOUS) ×2 IMPLANT
EVACUATOR SILICONE 100CC (DRAIN) IMPLANT
GLOVE BIOGEL M 8.0 STRL (GLOVE) ×2 IMPLANT
GOWN SPEC L4 XLG W/TWL (GOWN DISPOSABLE) ×2 IMPLANT
GOWN STRL REUS W/TWL XL LVL3 (GOWN DISPOSABLE) ×4 IMPLANT
KIT BASIN OR (CUSTOM PROCEDURE TRAY) ×2 IMPLANT
KIT TURNOVER KIT A (KITS) IMPLANT
MARKER SKIN DUAL TIP RULER LAB (MISCELLANEOUS) ×2 IMPLANT
MESH VENTRALEX ST 1-7/10 CRC S (Mesh General) ×2 IMPLANT
NEEDLE SPNL 22GX3.5 QUINCKE BK (NEEDLE) IMPLANT
PAD POSITIONING PINK XL (MISCELLANEOUS) IMPLANT
PROTECTOR NERVE ULNAR (MISCELLANEOUS) IMPLANT
SCISSORS LAP 5X45 EPIX DISP (ENDOMECHANICALS) ×2 IMPLANT
SET IRRIG TUBING LAPAROSCOPIC (IRRIGATION / IRRIGATOR) IMPLANT
SET TUBE SMOKE EVAC HIGH FLOW (TUBING) ×2 IMPLANT
SLEEVE XCEL OPT CAN 5 100 (ENDOMECHANICALS) ×2 IMPLANT
STAPLER VISISTAT 35W (STAPLE) IMPLANT
STRIP CLOSURE SKIN 1/2X4 (GAUZE/BANDAGES/DRESSINGS) IMPLANT
SUT MNCRL AB 4-0 PS2 18 (SUTURE) ×4 IMPLANT
SUT NOVA 0 T19/GS 22DT (SUTURE) IMPLANT
SUT NOVA NAB DX-16 0-1 5-0 T12 (SUTURE) ×2 IMPLANT
SUT NOVA NAB GS-21 0 18 T12 DT (SUTURE) IMPLANT
SUT VIC AB 4-0 SH 18 (SUTURE) ×2 IMPLANT
TACKER 5MM HERNIA 3.5CML NAB (ENDOMECHANICALS) IMPLANT
TOWEL OR 17X26 10 PK STRL BLUE (TOWEL DISPOSABLE) ×2 IMPLANT
TOWEL OR NON WOVEN STRL DISP B (DISPOSABLE) ×2 IMPLANT
TRAY FOLEY MTR SLVR 16FR STAT (SET/KITS/TRAYS/PACK) IMPLANT
TRAY LAPAROSCOPIC (CUSTOM PROCEDURE TRAY) ×2 IMPLANT
TROCAR BLADELESS OPT 5 100 (ENDOMECHANICALS) ×2 IMPLANT
TROCAR XCEL NON-BLD 11X100MML (ENDOMECHANICALS) IMPLANT

## 2019-09-25 NOTE — Anesthesia Preprocedure Evaluation (Addendum)
Anesthesia Evaluation  Patient identified by MRN, date of birth, ID band Patient awake    Reviewed: Allergy & Precautions, NPO status , Patient's Chart, lab work & pertinent test results  Airway Mallampati: II  TM Distance: >3 FB Neck ROM: Full    Dental  (+) Dental Advisory Given, Missing,    Pulmonary neg pulmonary ROS, Current Smoker,    Pulmonary exam normal breath sounds clear to auscultation       Cardiovascular negative cardio ROS Normal cardiovascular exam Rhythm:Regular Rate:Normal     Neuro/Psych  Neuromuscular disease negative psych ROS   GI/Hepatic negative GI ROS, Neg liver ROS,   Endo/Other  negative endocrine ROS  Renal/GU negative Renal ROS     Musculoskeletal  (+) Arthritis ,   Abdominal   Peds  Hematology negative hematology ROS (+)   Anesthesia Other Findings   Reproductive/Obstetrics                            Anesthesia Physical Anesthesia Plan  ASA: III  Anesthesia Plan: General   Post-op Pain Management:    Induction: Intravenous  PONV Risk Score and Plan: 3 and Ondansetron, Dexamethasone, Treatment may vary due to age or medical condition and Midazolam  Airway Management Planned: Oral ETT  Additional Equipment: None  Intra-op Plan:   Post-operative Plan: Extubation in OR  Informed Consent: I have reviewed the patients History and Physical, chart, labs and discussed the procedure including the risks, benefits and alternatives for the proposed anesthesia with the patient or authorized representative who has indicated his/her understanding and acceptance.     Dental advisory given  Plan Discussed with: CRNA  Anesthesia Plan Comments:       Anesthesia Quick Evaluation

## 2019-09-25 NOTE — Anesthesia Procedure Notes (Signed)
Procedure Name: Intubation Date/Time: 09/25/2019 12:24 PM Performed by: Glory Buff, CRNA Pre-anesthesia Checklist: Patient identified, Emergency Drugs available, Suction available and Patient being monitored Patient Re-evaluated:Patient Re-evaluated prior to induction Oxygen Delivery Method: Circle system utilized Preoxygenation: Pre-oxygenation with 100% oxygen Induction Type: IV induction Ventilation: Mask ventilation without difficulty Laryngoscope Size: Miller and 3 Grade View: Grade III Tube type: Oral Tube size: 7.5 mm Number of attempts: 1 Airway Equipment and Method: Stylet and Oral airway Placement Confirmation: ETT inserted through vocal cords under direct vision,  positive ETCO2 and breath sounds checked- equal and bilateral Secured at: 21 cm Tube secured with: Tape Dental Injury: Teeth and Oropharynx as per pre-operative assessment

## 2019-09-25 NOTE — Discharge Instructions (Signed)
Umbilical Hernia, Adult  A hernia is a bulge of tissue that pushes through an opening between muscles. An umbilical hernia happens in the abdomen, near the belly button (umbilicus). The hernia may contain tissues from the small intestine, large intestine, or fatty tissue covering the intestines (omentum). Umbilical hernias in adults tend to get worse over time, and they require surgical treatment. There are several types of umbilical hernias. You may have:  A hernia located just above or below the umbilicus (indirect hernia). This is the most common type of umbilical hernia in adults.  A hernia that forms through an opening formed by the umbilicus (direct hernia).  A hernia that comes and goes (reducible hernia). A reducible hernia may be visible only when you strain, lift something heavy, or cough. This type of hernia can be pushed back into the abdomen (reduced).  A hernia that traps abdominal tissue inside the hernia (incarcerated hernia). This type of hernia cannot be reduced.  A hernia that cuts off blood flow to the tissues inside the hernia (strangulated hernia). The tissues can start to die if this happens. This type of hernia requires emergency treatment. What are the causes? An umbilical hernia happens when tissue inside the abdomen presses on a weak area of the abdominal muscles. What increases the risk? You may have a greater risk of this condition if you:  Are obese.  Have had several pregnancies.  Have a buildup of fluid inside your abdomen (ascites).  Have had surgery that weakens the abdominal muscles. What are the signs or symptoms? The main symptom of this condition is a painless bulge at or near the belly button. A reducible hernia may be visible only when you strain, lift something heavy, or cough. Other symptoms may include:  Dull pain.  A feeling of pressure. Symptoms of a strangulated hernia may include:  Pain that gets increasingly worse.  Nausea and  vomiting.  Pain when pressing on the hernia.  Skin over the hernia becoming red or purple.  Constipation.  Blood in the stool. How is this diagnosed? This condition may be diagnosed based on:  A physical exam. You may be asked to cough or strain while standing. These actions increase the pressure inside your abdomen and force the hernia through the opening in your muscles. Your health care provider may try to reduce the hernia by pressing on it.  Your symptoms and medical history. How is this treated? Surgery is the only treatment for an umbilical hernia. Surgery for a strangulated hernia is done as soon as possible. If you have a small hernia that is not incarcerated, you may need to lose weight before having surgery. Follow these instructions at home:  Lose weight, if told by your health care provider.  Do not try to push the hernia back in.  Watch your hernia for any changes in color or size. Tell your health care provider if any changes occur.  You may need to avoid activities that increase pressure on your hernia.  Do not lift anything that is heavier than 10 lb (4.5 kg) until your health care provider says that this is safe.  Take over-the-counter and prescription medicines only as told by your health care provider.  Keep all follow-up visits as told by your health care provider. This is important. Contact a health care provider if:  Your hernia gets larger.  Your hernia becomes painful. Get help right away if:  You develop sudden, severe pain near the area of your hernia.    You have pain as well as nausea or vomiting.  You have pain and the skin over your hernia changes color.  You develop a fever. This information is not intended to replace advice given to you by your health care provider. Make sure you discuss any questions you have with your health care provider. Document Released: 03/24/2016 Document Revised: 12/05/2017 Document Reviewed: 04/23/2017 Elsevier  Patient Education  2020 Elsevier Inc.  

## 2019-09-25 NOTE — Anesthesia Procedure Notes (Signed)
Procedure Name: Intubation Date/Time: 09/25/2019 12:24 PM Performed by: Glory Buff, CRNA Pre-anesthesia Checklist: Patient identified, Emergency Drugs available, Suction available and Patient being monitored Patient Re-evaluated:Patient Re-evaluated prior to induction Oxygen Delivery Method: Circle system utilized Preoxygenation: Pre-oxygenation with 100% oxygen Induction Type: IV induction Ventilation: Mask ventilation without difficulty Tube type: Oral Number of attempts: 1 Airway Equipment and Method: Stylet and Oral airway Placement Confirmation: ETT inserted through vocal cords under direct vision,  positive ETCO2 and breath sounds checked- equal and bilateral Tube secured with: Tape Dental Injury: Teeth and Oropharynx as per pre-operative assessment

## 2019-09-25 NOTE — Transfer of Care (Signed)
Immediate Anesthesia Transfer of Care Note  Patient: Richard Skinner  Procedure(s) Performed: LAPAROSCOPIC REPAIR OF INCARCERATED UMBILICAL HERNIA WITH MESH (N/A Abdomen)  Patient Location: PACU  Anesthesia Type:General  Level of Consciousness: drowsy, patient cooperative and responds to stimulation  Airway & Oxygen Therapy: Patient Spontanous Breathing and Patient connected to face mask oxygen  Post-op Assessment: Report given to RN and Post -op Vital signs reviewed and stable  Post vital signs: Reviewed and stable  Last Vitals:  Vitals Value Taken Time  BP 161/131 09/25/19 1335  Temp    Pulse 77 09/25/19 1337  Resp 22 09/25/19 1337  SpO2 96 % 09/25/19 1337  Vitals shown include unvalidated device data.  Last Pain: There were no vitals filed for this visit.       Complications: No apparent anesthesia complications

## 2019-09-25 NOTE — Op Note (Signed)
Richard Skinner  [birth year] 25 September 2019    PCP:  Elby Beck, FNP   Surgeon: Kaylyn Lim, MD, FACS  Asst:  none  Anes:  general  Preop Dx: Incarcerated umbilical hernia Postop Dx: same  Procedure: Reduction of incarcerated fat from incarcerated umbilical hernia with smallest Ventralex mesh placed Location Surgery: [facility] 4 Complications: none  EBL:   minimal cc  Drains: none  Description of Procedure:  The patient was taken to OR 4 .  After anesthesia was administered and the patient was prepped  with Technicare and a timeout was performed.  Access achieved with 5 mm Optiview through the left upper quadrant.  Following insufflation, the second 5 mm was placed in the LLQ.  The incarcerated omentum was taken out.  The umbilical skin was reflected off the sac with a infraumbilical incision.  The small defect accomodated the smallest Ventralex mesh patch ~1.7 inches.  This was secured with two sutures of 0 Novafil.  The skin was tacked to the fascia with 4-0 monocryl.  The wounds were injected with Exparel and closed with 4-0 Monocryl.    The patient tolerated the procedure well and was taken to the PACU in stable condition.     Matt B. Hassell Done, [place], [facility], [place]

## 2019-09-25 NOTE — Anesthesia Postprocedure Evaluation (Signed)
Anesthesia Post Note  Patient: LAWYER WASHABAU[employer]  Procedure(s) Performed: LAPAROSCOPIC REPAIR OF INCARCERATED UMBILICAL HERNIA WITH MESH (N/A Abdomen)     Patient location during evaluation: PACU Anesthesia Type: General Level of consciousness: awake and alert and oriented Pain management: pain level controlled Vital Signs Assessment: post-procedure vital signs reviewed and stable Respiratory status: spontaneous breathing, nonlabored ventilation and respiratory function stable Cardiovascular status: blood pressure returned to baseline Postop Assessment: no apparent nausea or vomiting Anesthetic complications: no    Last Vitals:  Vitals:   09/25/19 1400 09/25/19 1418  BP: (!) 150/79 136/84  Pulse: 65 62  Resp: 16 18  Temp: (!) 36 C 36.4 C  SpO2: 95% 96%    Last Pain:  Vitals:   09/25/19 1418  TempSrc: Oral  PainSc: 0-No pain                 Brennan Bailey

## 2019-09-25 NOTE — Interval H&P Note (Signed)
History and Physical Interval Note:  09/25/2019 11:38 AM  Richard Skinner  has presented today for surgery, with the diagnosis of INCARCERATED UMBILICAL HERNIA.  The various methods of treatment have been discussed with the patient and family. After consideration of risks, benefits and other options for treatment, the patient has consented to  Procedure(s): [name] (N/A) as a surgical intervention.  The patient's history has been reviewed, patient examined, no change in status, stable for surgery.  I have reviewed the patient's chart and labs.  Questions were answered to the patient's satisfaction.     [name]

## 2019-09-26 ENCOUNTER — Encounter (HOSPITAL_COMMUNITY): Payer: Self-pay | Admitting: Surgery

## 2019-10-09 ENCOUNTER — Telehealth: Payer: Self-pay | Admitting: Family Medicine

## 2019-10-09 NOTE — Telephone Encounter (Signed)
Pt called wanting debbie to call him  His has question regarding diagnosis that was put in chart.    Date 07/12/2018 blister in belly button  Pt stated you told him that he had hernia but was not documented.   Workman's comp is denying claim.

## 2019-10-10 NOTE — Telephone Encounter (Signed)
Fax confirmation for 16 pages received. Advised patient of everything.

## 2019-10-10 NOTE — Telephone Encounter (Signed)
Called and spoke with patient.  He reports that he had office visit notes faxed as requested.  I am unclear as to what the issue is.  Office visit note from 07/12/2018 clearly documents presence of hernia.  I did place addendum on office visit note from 09/22/2019 that according to patient, he notified his employer of hernia the day after 07/12/2018 visit. The patient requests that we fax the notes to [phone] attention [address].  The case number is [id number]. Please call the patient once you fax the office notes from 07/12/2018 and 09/22/2019.  Please tell him I was unable to extend the note from 07/12/2018 due to its age.  I did add an addendum at the bottom of the 09/22/2019 note to indicate what he told me with regards to reporting the hernia to his employer.

## 2019-10-10 NOTE — Telephone Encounter (Signed)
Faxed notes. Waiting for confirmation before calling patient

## 2019-10-14 ENCOUNTER — Ambulatory Visit (HOSPITAL_COMMUNITY)
Admission: RE | Admit: 2019-10-14 | Discharge: 2019-10-14 | Disposition: A | Discharge status: Home / Self Care | Payer: BC Managed Care – PPO | Source: Ambulatory Visit | Attending: Cardiovascular Disease | Admitting: Cardiovascular Disease

## 2019-10-14 ENCOUNTER — Other Ambulatory Visit: Payer: Self-pay

## 2019-10-14 DIAGNOSIS — I872 Venous insufficiency (chronic) (peripheral): Secondary | ICD-10-CM | POA: Diagnosis not present

## 2020-01-30 ENCOUNTER — Other Ambulatory Visit: Payer: Self-pay

## 2020-01-30 ENCOUNTER — Ambulatory Visit (INDEPENDENT_AMBULATORY_CARE_PROVIDER_SITE_OTHER): Payer: BC Managed Care – PPO | Admitting: Family Medicine

## 2020-01-30 ENCOUNTER — Encounter: Payer: Self-pay | Admitting: Family Medicine

## 2020-01-30 VITALS — BP 134/66 | HR 74 | Temp 98.4°F | Ht 70.0 in | Wt 192.4 lb

## 2020-01-30 DIAGNOSIS — E782 Mixed hyperlipidemia: Secondary | ICD-10-CM

## 2020-01-30 DIAGNOSIS — I872 Venous insufficiency (chronic) (peripheral): Secondary | ICD-10-CM

## 2020-01-30 DIAGNOSIS — Z7289 Other problems related to lifestyle: Secondary | ICD-10-CM

## 2020-01-30 DIAGNOSIS — L03119 Cellulitis of unspecified part of limb: Secondary | ICD-10-CM | POA: Diagnosis not present

## 2020-01-30 DIAGNOSIS — R0989 Other specified symptoms and signs involving the circulatory and respiratory systems: Secondary | ICD-10-CM | POA: Diagnosis not present

## 2020-01-30 DIAGNOSIS — Z789 Other specified health status: Secondary | ICD-10-CM

## 2020-01-30 DIAGNOSIS — Z1211 Encounter for screening for malignant neoplasm of colon: Secondary | ICD-10-CM

## 2020-01-30 DIAGNOSIS — Z720 Tobacco use: Secondary | ICD-10-CM

## 2020-01-30 LAB — CBC WITH DIFFERENTIAL/PLATELET
Basophils Absolute: 0.1 K/uL (ref 0.0–0.1)
Basophils Relative: 1.2 % (ref 0.0–3.0)
Eosinophils Absolute: 0.4 K/uL (ref 0.0–0.7)
Eosinophils Relative: 6.1 % — ABNORMAL HIGH (ref 0.0–5.0)
HCT: 48.4 % (ref 39.0–52.0)
Hemoglobin: 16.7 g/dL (ref 13.0–17.0)
Lymphocytes Relative: 24.9 % (ref 12.0–46.0)
Lymphs Abs: 1.6 K/uL (ref 0.7–4.0)
MCHC: 34.4 g/dL (ref 30.0–36.0)
MCV: 98.1 fl (ref 78.0–100.0)
Monocytes Absolute: 0.8 K/uL (ref 0.1–1.0)
Monocytes Relative: 12.4 % — ABNORMAL HIGH (ref 3.0–12.0)
Neutro Abs: 3.5 K/uL (ref 1.4–7.7)
Neutrophils Relative %: 55.4 % (ref 43.0–77.0)
Platelets: 156 K/uL (ref 150.0–400.0)
RBC: 4.94 Mil/uL (ref 4.22–5.81)
RDW: 14.1 % (ref 11.5–15.5)
WBC: 6.3 K/uL (ref 4.0–10.5)

## 2020-01-30 LAB — COMPREHENSIVE METABOLIC PANEL
ALT: 39 U/L (ref 0–53)
AST: 49 U/L — ABNORMAL HIGH (ref 0–37)
Albumin: 4.3 g/dL (ref 3.5–5.2)
Alkaline Phosphatase: 86 U/L (ref 39–117)
BUN: 9 mg/dL (ref 6–23)
CO2: 27 mEq/L (ref 19–32)
Calcium: 9.4 mg/dL (ref 8.4–10.5)
Chloride: 103 mEq/L (ref 96–112)
Creatinine, Ser: 0.83 mg/dL (ref 0.40–1.50)
GFR: 94.35 mL/min (ref >60.00)
Glucose, Bld: 106 mg/dL — ABNORMAL HIGH (ref 70–99)
Potassium: 4.3 mEq/L (ref 3.5–5.1)
Sodium: 135 mEq/L (ref 135–145)
Total Bilirubin: 0.7 mg/dL (ref 0.2–1.2)
Total Protein: 7.4 g/dL (ref 6.0–8.3)

## 2020-01-30 LAB — SEDIMENTATION RATE: Sed Rate: 30 mm/hr — ABNORMAL HIGH (ref 0–20)

## 2020-01-30 MED ORDER — CEPHALEXIN 500 MG PO CAPS: 500.0000 mg | ORAL_CAPSULE | Freq: Two times a day (BID) | ORAL | 0 refills | Status: AC

## 2020-01-30 NOTE — Progress Notes (Signed)
Subjective:    Patient ID: Richard Skinner, male    DOB: [birth year], 59 y.o.   MRN: [id number]  HPI Chief Complaint  Patient presents with  . Leg Pain    bilateral, on going for awhile  . Cellulitis    bilateral, lower legs   This is a 59 yo male who presents today for above cc.   Lower extremity pain/ rash- this has been ongoing for approximately 2 years. He has had negative doppler ultrasound, has seen dermatology last year and diagnosed with venous stasis dermatitis.  Per patient (I do not have records from dermatology), he was instructed to elevate toes above nose for 1.5 hours daily which he has not been able to do consistently and wear compression socks which he states that he does but does not help. He has pain, itching, swelling as day goes on. Some numbness in left foot.   Hyperlipidemia- was started on rosuvastatin in fall. He got prescription filled but did not take. Concerned about potential side effects.   Tobacco abuse- smoking about 1 ppd. Has considered stopping. Smokes in his car and his garage. A friend gave him some left over nicotine patches which he tried. Felt that they were too strong, like he had smoked a pack of cigarettes. Did help with cravings.   Alcohol use- drinks 5-6 beers per day  Overweight- has been working on weight loss, eating less  Review of Systems Per HPI    Objective:   Physical Exam Vitals reviewed.  Constitutional:      General: He is not in acute distress.    Appearance: Normal appearance. He is normal weight. He is not ill-appearing, toxic-appearing or diaphoretic.  Eyes:     Conjunctiva/sclera: Conjunctivae normal.  Cardiovascular:     Rate and Rhythm: Normal rate.     Comments: Left DP trace palpable, PT +2, Right DP/PT +2.  Pulmonary:     Effort: Pulmonary effort is normal.  Musculoskeletal:     Right lower leg: No edema.     Left lower leg: No edema.  Skin:    General: Skin is warm and dry.     Comments: Bilateral  anterior LE with erythema, hyperpigmentation. Left LE anterior with multiple, small areas excoriation. Erythematous areas slightly warm. Feet normal color and temperature.   Neurological:     Mental Status: He is alert and oriented to person, place, and time.  Psychiatric:        Mood and Affect: Mood normal.        Behavior: Behavior normal.        Thought Content: Thought content normal.        Judgment: Judgment normal.          BP 134/66   Pulse 74   Temp 98.4 F (36.9 C)   Ht 5\' 10"  (1.778 m)   Wt 192 lb 6.4 oz (87.3 kg)   SpO2 96%   BMI 27.61 kg/m  Wt Readings from Last 3 Encounters:  01/30/20 192 lb 6.4 oz (87.3 kg)  09/24/19 198 lb (89.8 kg)  09/22/19 199 lb (90.3 kg)    Assessment & Plan:  1. Cellulitis of lower extremity, unspecified laterality - concern with open areas, will give course antibiotic, refer to vascular - cephALEXin (KEFLEX) 500 MG capsule; Take 1 capsule (500 mg total) by mouth 2 (two) times daily for 7 days.  Dispense: 14 capsule; Refill: 0 - Comprehensive metabolic panel - Sedimentation rate - CBC with Differential  2. Venous stasis dermatitis of both lower extremities - Ambulatory referral to Vascular Surgery  3. Decreased pedal pulses - Ambulatory referral to Vascular Surgery  4. Mixed hyperlipidemia - discussed ASCVD risk and use of statin. Patient agreeable to start. Discussed every other day dosing.   5. Tobacco abuse - Provided written and verbal information regarding smoking cessation and encouraged him to consider quitting.   6. Alcohol use - discussed recommendation of no more than 2 alcoholic drinks per day for men  7. Colon cancer screening - previously referred him to GI, they were unable to get in touch with him. He prefers Cologuard.  - follow up in 6 months for CPE   Olean Ree, FNP-BC  [facility] at [facility], [facility]  01/30/2020 10:42 AM

## 2020-01-30 NOTE — Patient Instructions (Addendum)
Good to see you today   I have put in a referral for you to see a vascular surgeon  Please call your insurance company to see if Cologuard is covered, let me know if it is  Consider stopping smoking  Schedule your complete physical for 6 months   Coping with Quitting Smoking  Quitting smoking is a physical and mental challenge. You will face cravings, withdrawal symptoms, and temptation. Before quitting, work with your health care provider to make a plan that can help you cope. Preparation can help you quit and keep you from giving in. How can I cope with cravings? Cravings usually last for 5-10 minutes. If you get through it, the craving will pass. Consider taking the following actions to help you cope with cravings:  Keep your mouth busy: ? Chew sugar-free gum. ? Suck on hard candies or a straw. ? Brush your teeth.  Keep your hands and body busy: ? Immediately change to a different activity when you feel a craving. ? Squeeze or play with a ball. ? Do an activity or a hobby, like making bead jewelry, practicing needlepoint, or working with wood. ? Mix up your normal routine. ? Take a short exercise break. Go for a quick walk or run up and down stairs. ? Spend time in public places where smoking is not allowed.  Focus on doing something kind or helpful for someone else.  Call a friend or family member to talk during a craving.  Join a support group.  Call a quit line, such as 1-800-QUIT-NOW.  Talk with your health care provider about medicines that might help you cope with cravings and make quitting easier for you. How can I deal with withdrawal symptoms? Your body may experience negative effects as it tries to get used to not having nicotine in the system. These effects are called withdrawal symptoms. They may include:  Feeling hungrier than normal.  Trouble concentrating.  Irritability.  Trouble sleeping.  Feeling depressed.  Restlessness and  agitation.  Craving a cigarette. To manage withdrawal symptoms:  Avoid places, people, and activities that trigger your cravings.  Remember why you want to quit.  Get plenty of sleep.  Avoid coffee and other caffeinated drinks. These may worsen some of your symptoms. How can I handle social situations? Social situations can be difficult when you are quitting smoking, especially in the first few weeks. To manage this, you can:  Avoid parties, bars, and other social situations where people might be smoking.  Avoid alcohol.  Leave right away if you have the urge to smoke.  Explain to your family and friends that you are quitting smoking. Ask for understanding and support.  Plan activities with friends or family where smoking is not an option. What are some ways I can cope with stress? Wanting to smoke may cause stress, and stress can make you want to smoke. Find ways to manage your stress. Relaxation techniques can help. For example:  Breathe slowly and deeply, in through your nose and out through your mouth.  Listen to soothing, relaxing music.  Talk with a family member or friend about your stress.  Light a candle.  Soak in a bath or take a shower.  Think about a peaceful place. What are some ways I can prevent weight gain? Be aware that many people gain weight after they quit smoking. However, not everyone does. To keep from gaining weight, have a plan in place before you quit and stick to the plan  after you quit. Your plan should include:  Having healthy snacks. When you have a craving, it may help to: ? Eat plain popcorn, crunchy carrots, celery, or other cut vegetables. ? Chew sugar-free gum.  Changing how you eat: ? Eat small portion sizes at meals. ? Eat 4-6 small meals throughout the day instead of 1-2 large meals a day. ? Be mindful when you eat. Do not watch television or do other things that might distract you as you eat.  Exercising regularly: ? Make time  to exercise each day. If you do not have time for a long workout, do short bouts of exercise for 5-10 minutes several times a day. ? Do some form of strengthening exercise, like weight lifting, and some form of aerobic exercise, like running or swimming.  Drinking plenty of water or other low-calorie or no-calorie drinks. Drink 6-8 glasses of water daily, or as much as instructed by your health care provider. Summary  Quitting smoking is a physical and mental challenge. You will face cravings, withdrawal symptoms, and temptation to smoke again. Preparation can help you as you go through these challenges.  You can cope with cravings by keeping your mouth busy (such as by chewing gum), keeping your body and hands busy, and making calls to family, friends, or a helpline for people who want to quit smoking.  You can cope with withdrawal symptoms by avoiding places where people smoke, avoiding drinks with caffeine, and getting plenty of rest.  Ask your health care provider about the different ways to prevent weight gain, avoid stress, and handle social situations. This information is not intended to replace advice given to you by your health care provider. Make sure you discuss any questions you have with your health care provider. Document Revised: 10/05/2017 Document Reviewed: 10/20/2016 Elsevier Patient Education  2020 [employer].

## 2020-03-03 ENCOUNTER — Other Ambulatory Visit: Payer: Self-pay | Admitting: *Deleted

## 2020-03-03 DIAGNOSIS — M25562 Pain in left knee: Secondary | ICD-10-CM

## 2020-03-03 DIAGNOSIS — M25561 Pain in right knee: Secondary | ICD-10-CM

## 2020-03-04 ENCOUNTER — Telehealth (HOSPITAL_COMMUNITY): Payer: Self-pay

## 2020-03-04 NOTE — Telephone Encounter (Signed)

## 2020-03-05 ENCOUNTER — Ambulatory Visit (INDEPENDENT_AMBULATORY_CARE_PROVIDER_SITE_OTHER): Payer: BC Managed Care – PPO | Admitting: Vascular Surgery

## 2020-03-05 ENCOUNTER — Encounter: Payer: Self-pay | Admitting: Vascular Surgery

## 2020-03-05 ENCOUNTER — Ambulatory Visit (HOSPITAL_COMMUNITY)
Admission: RE | Admit: 2020-03-05 | Discharge: 2020-03-05 | Disposition: A | Discharge status: Home / Self Care | Payer: BC Managed Care – PPO | Source: Ambulatory Visit | Attending: Surgery | Admitting: Surgery

## 2020-03-05 ENCOUNTER — Other Ambulatory Visit: Payer: Self-pay

## 2020-03-05 VITALS — BP 146/79 | HR 69 | Temp 98.2°F | Resp 18 | Ht 70.0 in | Wt 197.0 lb

## 2020-03-05 DIAGNOSIS — R21 Rash and other nonspecific skin eruption: Secondary | ICD-10-CM | POA: Diagnosis not present

## 2020-03-05 DIAGNOSIS — M25561 Pain in right knee: Secondary | ICD-10-CM | POA: Insufficient documentation

## 2020-03-05 DIAGNOSIS — M25562 Pain in left knee: Secondary | ICD-10-CM | POA: Insufficient documentation

## 2020-03-05 NOTE — Progress Notes (Signed)
Patient ID: Richard Skinner, male   DOB: 1961-07-07, 59 y.o.   MRN: [id number]  Reason for Consult: New Patient (Initial Visit) (decreased pedal pulses)   Referred by Elby Beck, FNP  Subjective:     HPI:  Richard Skinner is a 59 y.o. male history of bilateral lower extremity lateral leg rashes.  He is undergone venous studies now here with ABIs today.  States this has been present for 21 months left greater than right.  He does not use any specific lotions.  He has seen multiple previous physicians including dermatology and his primary care doctor.  He does not have any other ulceration.  He walks without limitation.  Continues to work daily.  States he occasionally has swelling at the end of work usually no swelling of his legs.  Denies any history of stroke.  He is a daily smoker.  He also take NSAIDs daily for knee pain.  He has previous right knee surgery on 2 occasions when he was 12 and 27 but no other lower extremity surgeries  Past Medical History:  Diagnosis Date  . Arthritis   . History of chicken pox   . History of hepatitis    Family History  Problem Relation Age of Onset  . Healthy Mother   . Arthritis Father   . Diabetes Paternal Grandfather    Past Surgical History:  Procedure Laterality Date  . BACK SURGERY     Microdiscectomy  . Carpel tunnel Right   . KNEE SURGERY Right    X2  . NECK SURGERY     Discectomy and fusion.  Marland Kitchen UMBILICAL HERNIA REPAIR N/A 09/25/2019   Procedure: LAPAROSCOPIC REPAIR OF INCARCERATED UMBILICAL HERNIA WITH MESH;  Surgeon: Johnathan Hausen, MD;  Location: [facility];  Service: General;  Laterality: N/A;  . VARICOCELE EXCISION      Short Social History:  Social History   Tobacco Use  . Smoking status: Current Every Day Smoker  . Smokeless tobacco: Never Used  Substance Use Topics  . Alcohol use: Yes    Alcohol/week: 24.0 standard drinks    Types: 24 Cans of beer per week    Allergies  Allergen Reactions  . Atorvastatin  Other (See Comments)    Confusion    Current Outpatient Medications  Medication Sig Dispense Refill  . Aspirin-Acetaminophen (GOODYS BODY PAIN PO) Take by mouth.    Marland Kitchen ibuprofen (ADVIL,MOTRIN) 200 MG tablet Take 400-600 mg by mouth every 6 (six) hours as needed (knee pain.).     Marland Kitchen rosuvastatin (CRESTOR) 10 MG tablet Take 1 tablet (10 mg total) by mouth daily. (Patient not taking: Reported on 03/05/2020) 30 tablet 5   No current facility-administered medications for this visit.    Review of Systems  Constitutional:  Constitutional negative. HENT: HENT negative.  Eyes: Eyes negative.  Respiratory: Respiratory negative.  Cardiovascular: Cardiovascular negative.  GI: Gastrointestinal negative.  Skin: Positive for rash. Negative for wound.  Neurological: Neurological negative. Hematologic: Hematologic/lymphatic negative.  Psychiatric: Psychiatric negative.        Objective:  Objective   Vitals:   03/05/20 1041  BP: (!) 146/79  Pulse: 69  Resp: 18  Temp: 98.2 F (36.8 C)  TempSrc: Temporal  SpO2: 95%  Weight: 197 lb (89.4 kg)  Height: 5\' 10"  (1.778 m)   Body mass index is 28.27 kg/m.  Physical Exam HENT:     Nose:     Comments: Mask in place Eyes:     Pupils: Pupils  are equal, round, and reactive to light.  Neck:     Vascular: No carotid bruit.  Cardiovascular:     Rate and Rhythm: Normal rate and regular rhythm.     Heart sounds: Normal heart sounds.  Pulmonary:     Effort: Pulmonary effort is normal.     Breath sounds: Normal breath sounds.  Abdominal:     Palpations: Abdomen is soft. There is no mass.  Skin:    Capillary Refill: Capillary refill takes less than 2 seconds.     Comments: Superficial red discoloration left lateral and right lateral leg  Neurological:     General: No focal deficit present.     Mental Status: He is alert and oriented to person, place, and time.  Psychiatric:        Mood and Affect: Mood normal.        Behavior: Behavior  normal.        Thought Content: Thought content normal.        Judgment: Judgment normal.     Data: Independent interpreted his ABIs to be 1.2 bilaterally and triphasic.     Assessment/Plan:     59 year old male presents for evaluation bilateral lower extremity rashes.  He has had both venous and arterial evaluations both of which are normal.  He has palpable pulses.  I do not think that this appears vascular in nature certainly not macrovascular in nature.  He demonstrates good understanding of this he can follow-up on an as-needed basis.     Maeola Harman MD [facility]

## 2020-03-11 ENCOUNTER — Ambulatory Visit: Payer: BC Managed Care – PPO | Admitting: Internal Medicine

## 2020-03-11 ENCOUNTER — Other Ambulatory Visit: Payer: Self-pay

## 2020-03-11 ENCOUNTER — Encounter: Payer: Self-pay | Admitting: Internal Medicine

## 2020-03-11 DIAGNOSIS — L309 Dermatitis, unspecified: Secondary | ICD-10-CM

## 2020-03-11 MED ORDER — MOMETASONE FUROATE 0.1 % EX SOLN: Freq: Every day | CUTANEOUS | 3 refills | Status: DC

## 2020-03-11 NOTE — Progress Notes (Signed)
Subjective:    Patient ID: Richard Skinner, male    DOB: 07-07-1961, 59 y.o.   MRN: [id number]  HPI Here due to recurrent leg rash/infection This visit occurred during the SARS-CoV-2 public health emergency.  Safety protocols were in place, including screening questions prior to the visit, additional usage of staff PPE, and extensive cleaning of exam room while observing appropriate contact time as indicated for disinfecting solutions.   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 Aspirin-Acetaminophen (GOODYS BODY PAIN PO) Take by mouth.    Marland Kitchen ibuprofen (ADVIL,MOTRIN) 200 MG tablet Take 400-600 mg by mouth every 6 (six) hours as needed (knee pain.).     Marland Kitchen rosuvastatin (CRESTOR) 10 MG tablet Take 1 tablet (10 mg total) by mouth daily. (Patient not taking: Reported on 03/05/2020) 30 tablet 5   No current facility-administered medications on file prior to visit.    Allergies  Allergen Reactions  . Atorvastatin Other (See Comments)    Confusion    Past Medical History:  Diagnosis Date  . Arthritis   . History of chicken pox   . History of hepatitis     Past Surgical History:  Procedure Laterality Date  . BACK SURGERY     Microdiscectomy  . Carpel tunnel Right   . KNEE SURGERY Right    X2  . NECK SURGERY     Discectomy and fusion.  Marland Kitchen UMBILICAL HERNIA REPAIR N/A 09/25/2019   Procedure: LAPAROSCOPIC REPAIR OF INCARCERATED UMBILICAL HERNIA WITH MESH;  Surgeon: Luretha Murphy, MD;  Location: [facility];   Service: General;  Laterality: N/A;  . VARICOCELE EXCISION      Family History  Problem Relation Age of Onset  . Healthy Mother   . Arthritis Father   . Diabetes Paternal Grandfather     Social History   Socioeconomic History  . Marital status: Married    Spouse name: Not on file  . Number of children: 1  . Years of education: 65  . Highest education level: Not on file  Occupational History  . Occupation: Counsellor  Tobacco Use  . Smoking status: Current Every Day Smoker  . Smokeless tobacco: Never Used  Substance and Sexual Activity  . Alcohol use: Yes    Alcohol/week: 24.0 standard drinks    Types: 24 Cans of beer per week  . Drug use: No  . Sexual activity: Yes  Other Topics Concern  . Not on file  Social History Narrative   Fun/Hobby: Fish    Social Determinants of Health   Financial Resource Strain:   . Difficulty of Paying Living Expenses:   Food Insecurity:   . Worried About Programme researcher, broadcasting/film/video in the Last Year:   . Barista in the Last Year:   Transportation Needs:   . Freight forwarder (Medical):   Marland Kitchen Lack of Transportation (Non-Medical):   Physical Activity:   . Days of Exercise per Week:   . Minutes of Exercise per Session:   Stress:   . Feeling of  Stress :   Social Connections:   . Frequency of Communication with Friends and Family:   . Frequency of Social Gatherings with Friends and Family:   . Attends Religious Services:   . Active Member of Clubs or Organizations:   . Attends Archivist Meetings:   Marland Kitchen Marital Status:   Intimate Partner Violence:   . Fear of Current or Ex-Partner:   . Emotionally Abused:   Marland Kitchen Physically Abused:   . Sexually Abused:    Review of Systems No fever Rash gets warm at times though No change when he is on vacation or off Hasn't started the crestor    Objective:   Physical Exam  Constitutional: He appears well-developed. No distress.  Skin:  Brawny and slightly red discoloration  along lateral left calf (entire) and a small portion of right calf No infection No ulcers           Assessment & Plan:

## 2020-03-11 NOTE — Assessment & Plan Note (Signed)
Appearance is classic for stasis derm--but he doesn't have venous disease Will try cortisone lotion again Discussed moisturizing soap and cream after shower Probably needs second opinion from a different dermatologist

## 2020-03-11 NOTE — Patient Instructions (Signed)
Please try the new lotion (like at night) and use moisturizers after showering. If the rash is no better in the next couple of weeks, I will set you up with a different dermatologist.

## 2023-05-17 ENCOUNTER — Telehealth: Payer: Self-pay | Admitting: Family Medicine

## 2023-05-17 NOTE — Telephone Encounter (Signed)
Noted  

## 2023-05-17 NOTE — Telephone Encounter (Signed)
Pt called stating he believes he might have a testicle infection. Pt states his left testicle is about 3/4x bigger than the right. Pt states he had a boil on the left testicle sometime before but it left on its own. Pt states he hasn't been seen for the issue by UC or ED. Told pt hasn't been seen since 03/2020 with [place], so he's considered to be a new pt. Pt states he didn't want to visit a UC or ED, he believed it'll be better to see someone in our office. Pt states he isn't in any pain. Attempted to transfer pt to access nurse, access nurse rep stated since pt hasn't been seen in 3 years, a access nurse wouldn't be able to speak to the pt. Trasnferred call the Rena. Scheduled new pt visit with Cable on 08/06/23. Call back # [phone]

## 2023-05-17 NOTE — Telephone Encounter (Signed)
I spoke with pt; pt said starting few months ago lt testicle would have swelling on and off and now pt said the lt testicle  is swollen 3-4 times it normal size.Marland Kitchen No pain and no problems urinating pt said Lt testicle feels heavy.no fever and no redness. Pt does not feel like he needs to go to UC or ED and pt will keep appt with Dr Ermalene Searing 05/18/23 at 12noon with UC & ED precautions and pt voiced understanding if starts having pain, difficulty urinating,sees blood,has redness at testicle or fever pt will go to ED. Sending note to Dr Ermalene Searing.

## 2023-05-18 ENCOUNTER — Ambulatory Visit: Payer: BC Managed Care – PPO | Admitting: Family Medicine

## 2023-05-18 ENCOUNTER — Encounter: Payer: Self-pay | Admitting: Family Medicine

## 2023-05-18 VITALS — BP 130/70 | HR 64 | Temp 98.1°F | Ht 67.75 in | Wt 192.1 lb

## 2023-05-18 DIAGNOSIS — N5089 Other specified disorders of the male genital organs: Secondary | ICD-10-CM | POA: Insufficient documentation

## 2023-05-18 LAB — POC URINALSYSI DIPSTICK (AUTOMATED)
Bilirubin, UA: NEGATIVE
Glucose, UA: NEGATIVE
Ketones, UA: NEGATIVE
Leukocytes, UA: NEGATIVE
Nitrite, UA: NEGATIVE
Protein, UA: NEGATIVE
Spec Grav, UA: 1.02 (ref 1.010–1.025)
Urobilinogen, UA: 2 E.U./dL — AB
pH, UA: 6 (ref 5.0–8.0)

## 2023-05-18 NOTE — Assessment & Plan Note (Addendum)
Chronic, now ongoing for more than 9 months.  No associated pain.  Exam reveals no evidence of varicocele, hydrocele, scrotal abscess.  Testicle itself is swollen uniformly without nodules.  There is no scrotal swelling No lymphadenopathy in groin.  Will evaluate urinalysis sent for gonorrhea and chlamydia.  Refer for stat ultrasound to evaluate the testicle.

## 2023-05-18 NOTE — Patient Instructions (Addendum)
Call  to  change appt for  [place] [phone]  if needed or at your time preference.  Current appointment: Tuesday 7/16 at 10:15am, arrive by 9:55am [place] location - [address] [facility] [place] [facility] [place] No Show Fee (cancel within 24 hrs) $75 Fee No prep

## 2023-05-18 NOTE — Progress Notes (Signed)
Patient ID: Richard Skinner, male    DOB: 02/20/1961, 62 y.o.   MRN: [id number]  This visit was conducted in person.  BP 130/70 (BP Location: Right Arm, Patient Position: Sitting, Cuff Size: Large)   Pulse 64   Temp 98.1 F (36.7 C) (Temporal)   Ht 5' 7.75" (1.721 m)   Wt 192 lb 2 oz (87.1 kg)   SpO2 95%   BMI 29.43 kg/m    CC:  Chief Complaint  Patient presents with   Groin Swelling    Left Testicle    Subjective:   HPI: Richard Skinner is a 62 y.o. male presenting on 05/18/2023 for Groin Swelling (Left Testicle)    PCP no longer at our office.. needs to re-establish.   In last 8-10 month he has noted swelling in left testicle.. 3 times the size as proper.  Seems to be better during the day when up on feet.  Non tender, no redness.   No dysuria, no frequency or urgency.  No fever.  No flow issue.  Ejaculation different more dribbling.  No blood ejaculate.    Also scrotal abscess/infection on left.. resolved... did  note  2-3 week prior to swelling began.    No  family history of prostate cancer or testicular cancer.  Current smoker.       Relevant past medical, surgical, family and social history reviewed and updated as indicated. Interim medical history since our last visit reviewed. Allergies and medications reviewed and updated. Outpatient Medications Prior to Visit  Medication Sig Dispense Refill   Aspirin-Acetaminophen (GOODYS BODY PAIN PO) Take by mouth.     ibuprofen (ADVIL,MOTRIN) 200 MG tablet Take 400-600 mg by mouth every 6 (six) hours as needed (knee pain.).      rosuvastatin (CRESTOR) 10 MG tablet Take 1 tablet (10 mg total) by mouth daily. (Patient not taking: Reported on 03/05/2020) 30 tablet 5   mometasone (ELOCON) 0.1 % lotion Apply topically daily. To legs 120 mL 3   No facility-administered medications prior to visit.     Per HPI unless specifically indicated in ROS section below Review of Systems  Constitutional:  Negative for fatigue  and fever.  HENT:  Negative for ear pain.   Eyes:  Negative for pain.  Respiratory:  Negative for cough and shortness of breath.   Cardiovascular:  Negative for chest pain, palpitations and leg swelling.  Gastrointestinal:  Negative for abdominal pain.  Genitourinary:  Negative for dysuria.  Musculoskeletal:  Negative for arthralgias.  Neurological:  Negative for syncope, light-headedness and headaches.  Psychiatric/Behavioral:  Negative for dysphoric mood.    Objective:  BP 130/70 (BP Location: Right Arm, Patient Position: Sitting, Cuff Size: Large)   Pulse 64   Temp 98.1 F (36.7 C) (Temporal)   Ht 5' 7.75" (1.721 m)   Wt 192 lb 2 oz (87.1 kg)   SpO2 95%   BMI 29.43 kg/m   Wt Readings from Last 3 Encounters:  05/18/23 192 lb 2 oz (87.1 kg)  03/11/20 197 lb (89.4 kg)  03/05/20 197 lb (89.4 kg)      Physical Exam Exam conducted with a chaperone present.  Constitutional:      Appearance: He is well-developed.  HENT:     Head: Normocephalic.     Right Ear: Hearing normal.     Left Ear: Hearing normal.     Nose: Nose normal.  Neck:     Thyroid: No thyroid mass or thyromegaly.  Vascular: No carotid bruit.     Trachea: Trachea normal.  Cardiovascular:     Rate and Rhythm: Normal rate and regular rhythm.     Pulses: Normal pulses.     Heart sounds: Heart sounds not distant. No murmur heard.    No friction rub. No gallop.     Comments: No peripheral edema Pulmonary:     Effort: Pulmonary effort is normal. No respiratory distress.     Breath sounds: Normal breath sounds.  Abdominal:     Hernia: There is no hernia in the left inguinal area.  Genitourinary:    Penis: Normal and circumcised. No phimosis, paraphimosis, hypospadias, erythema, tenderness, discharge, swelling or lesions.      Testes:        Right: Mass, tenderness, swelling, testicular hydrocele or varicocele not present. Right testis is descended. Cremasteric reflex is present.         Left: Swelling  present. Mass, tenderness, testicular hydrocele or varicocele not present. Left testis is descended. Cremasteric reflex is present.      Epididymis:     Right: Normal.     Left: Normal. Not inflamed or enlarged. No mass or tenderness.     Comments: Uniform diffuse swelling of testicle without nodules Skin:    General: Skin is warm and dry.     Findings: No rash.  Psychiatric:        Speech: Speech normal.        Behavior: Behavior normal.        Thought Content: Thought content normal.       Results for orders placed or performed in visit on 01/30/20  Comprehensive metabolic panel  Result Value Ref Range   Sodium 135 135 - 145 mEq/L   Potassium 4.3 3.5 - 5.1 mEq/L   Chloride 103 96 - 112 mEq/L   CO2 27 19 - 32 mEq/L   Glucose, Bld 106 (H) 70 - 99 mg/dL   BUN 9 6 - 23 mg/dL   Creatinine, Ser 1.61 0.40 - 1.50 mg/dL   Total Bilirubin 0.7 0.2 - 1.2 mg/dL   Alkaline Phosphatase 86 39 - 117 U/L   AST 49 (H) 0 - 37 U/L   ALT 39 0 - 53 U/L   Total Protein 7.4 6.0 - 8.3 g/dL   Albumin 4.3 3.5 - 5.2 g/dL   GFR 09.60 >45.40 mL/min   Calcium 9.4 8.4 - 10.5 mg/dL  Sedimentation rate  Result Value Ref Range   Sed Rate 30 (H) 0 - 20 mm/hr  CBC with Differential  Result Value Ref Range   WBC 6.3 4.0 - 10.5 K/uL   RBC 4.94 4.22 - 5.81 Mil/uL   Hemoglobin 16.7 13.0 - 17.0 g/dL   HCT 98.1 19.1 - 47.8 %   MCV 98.1 78.0 - 100.0 fl   MCHC 34.4 30.0 - 36.0 g/dL   RDW 29.5 62.1 - 30.8 %   Platelets 156.0 150.0 - 400.0 K/uL   Neutrophils Relative % 55.4 43.0 - 77.0 %   Lymphocytes Relative 24.9 12.0 - 46.0 %   Monocytes Relative 12.4 (H) 3.0 - 12.0 %   Eosinophils Relative 6.1 (H) 0.0 - 5.0 %   Basophils Relative 1.2 0.0 - 3.0 %   Neutro Abs 3.5 1.4 - 7.7 K/uL   Lymphs Abs 1.6 0.7 - 4.0 K/uL   Monocytes Absolute 0.8 0.1 - 1.0 K/uL   Eosinophils Absolute 0.4 0.0 - 0.7 K/uL   Basophils Absolute 0.1 0.0 - 0.1 K/uL  Assessment and Plan  There are no diagnoses linked to this  encounter.  No follow-ups on file.   Kerby Nora, MD

## 2023-05-18 NOTE — Addendum Note (Signed)
Addended by: Damita Lack on: 05/18/2023 01:14 PM   Modules accepted: Orders

## 2023-05-19 LAB — C. TRACHOMATIS/N. GONORRHOEAE RNA
C. trachomatis RNA, TMA: NOT DETECTED
N. gonorrhoeae RNA, TMA: NOT DETECTED

## 2023-05-22 ENCOUNTER — Ambulatory Visit
Admission: RE | Admit: 2023-05-22 | Discharge: 2023-05-22 | Disposition: A | Discharge status: Home / Self Care | Payer: BC Managed Care – PPO | Source: Ambulatory Visit | Attending: Family Medicine | Admitting: Family Medicine

## 2023-05-22 ENCOUNTER — Other Ambulatory Visit: Payer: BC Managed Care – PPO

## 2023-05-22 DIAGNOSIS — N433 Hydrocele, unspecified: Secondary | ICD-10-CM | POA: Diagnosis not present

## 2023-05-22 DIAGNOSIS — N5089 Other specified disorders of the male genital organs: Secondary | ICD-10-CM

## 2023-08-06 ENCOUNTER — Ambulatory Visit: Payer: BC Managed Care – PPO | Admitting: Nurse Practitioner

## 2023-08-09 ENCOUNTER — Ambulatory Visit: Payer: BC Managed Care – PPO | Admitting: Nurse Practitioner

## 2023-08-09 ENCOUNTER — Encounter: Payer: Self-pay | Admitting: Nurse Practitioner

## 2023-08-09 VITALS — BP 138/76 | HR 75 | Temp 98.2°F | Ht 68.0 in | Wt 194.2 lb

## 2023-08-09 DIAGNOSIS — Z125 Encounter for screening for malignant neoplasm of prostate: Secondary | ICD-10-CM

## 2023-08-09 DIAGNOSIS — Z720 Tobacco use: Secondary | ICD-10-CM

## 2023-08-09 DIAGNOSIS — E782 Mixed hyperlipidemia: Secondary | ICD-10-CM

## 2023-08-09 DIAGNOSIS — Z0000 Encounter for general adult medical examination without abnormal findings: Secondary | ICD-10-CM

## 2023-08-09 DIAGNOSIS — E663 Overweight: Secondary | ICD-10-CM

## 2023-08-09 NOTE — Progress Notes (Signed)
Established Patient Office Visit  Subjective   Patient ID: Richard Skinner, male    DOB: 10/25/1961  Age: 62 y.o. MRN: [id number]  Chief Complaint  Patient presents with   Transitions Of Care    HPI  Transfer of care: last seen by Dr Standley Brooking, previous Dr. Alphonsus Sias, then Deboraha Sprang   HLD  for complete physical and follow up of chronic conditions.  Immunizations: -Tetanus: Unsure, refused today -Influenza: refused  -Shingles: refused -Pneumonia: Too young -covid: refused   Diet: Fair diet. 3 meals a day at least. Sometimes he will snack. Coffee and water  Exercise: No regular exercise. work and yard work   [employer] exam: PRN Dental exam: Completes semi-annually    Colonoscopy: Patient refused colonoscopy we discussed about Cologuard he would defer at this current juncture Lung Cancer Screening: refused  PSA: Due  Sleep: 830-9 and up at 4am. Feels rested some days. Does not snore        Review of Systems  Constitutional:  Negative for chills and fever.  Respiratory:  Negative for shortness of breath.   Cardiovascular:  Negative for chest pain and leg swelling.  Gastrointestinal:  Negative for abdominal pain, blood in stool, constipation, diarrhea, nausea and vomiting.       BM daily   Genitourinary:  Negative for dysuria and hematuria.  Neurological:  Negative for tingling and headaches.  Psychiatric/Behavioral:  Negative for hallucinations and suicidal ideas.       Objective:     BP 138/76   Pulse 75   Temp 98.2 F (36.8 C) (Oral)   Ht 5\' 8"  (1.727 m)   Wt 194 lb 3.2 oz (88.1 kg)   SpO2 95%   BMI 29.53 kg/m  BP Readings from Last 3 Encounters:  08/09/23 138/76  05/18/23 130/70  03/11/20 136/88   Wt Readings from Last 3 Encounters:  08/09/23 194 lb 3.2 oz (88.1 kg)  05/18/23 192 lb 2 oz (87.1 kg)  03/11/20 197 lb (89.4 kg)   SpO2 Readings from Last 3 Encounters:  08/09/23 95%  05/18/23 95%  03/11/20 93%      Physical Exam Vitals and  nursing note reviewed.  Constitutional:      Appearance: Normal appearance.  HENT:     Right Ear: Tympanic membrane, ear canal and external ear normal.     Left Ear: Tympanic membrane, ear canal and external ear normal.     Mouth/Throat:     Mouth: Mucous membranes are moist.     Pharynx: Oropharynx is clear.  Eyes:     Extraocular Movements: Extraocular movements intact.     Pupils: Pupils are equal, round, and reactive to light.  Cardiovascular:     Rate and Rhythm: Normal rate and regular rhythm.     Pulses: Normal pulses.     Heart sounds: Normal heart sounds.  Pulmonary:     Effort: Pulmonary effort is normal.     Breath sounds: Normal breath sounds.  Abdominal:     General: Bowel sounds are normal. There is no distension.     Palpations: There is no mass.     Tenderness: There is no abdominal tenderness.     Hernia: No hernia is present.  Musculoskeletal:     Right lower leg: No edema.     Left lower leg: No edema.  Lymphadenopathy:     Cervical: No cervical adenopathy.  Skin:    General: Skin is warm.  Neurological:     General: No focal  deficit present.     Mental Status: He is alert.     Deep Tendon Reflexes:     Reflex Scores:      Bicep reflexes are 2+ on the right side and 2+ on the left side.      Patellar reflexes are 2+ on the right side and 2+ on the left side.    Comments: Bilateral upper and lower extremity strength 5/5  Psychiatric:        Mood and Affect: Mood normal.        Behavior: Behavior normal.        Thought Content: Thought content normal.        Judgment: Judgment normal.      No results found for any visits on 08/09/23.    The ASCVD Risk score (Arnett DK, et al., 2019) failed to calculate for the following reasons:   Cannot find a previous HDL lab   Cannot find a previous total cholesterol lab    Assessment & Plan:   Problem List Items Addressed This Visit       Other   Tobacco abuse    Check urine microscopy for  microscopic hematuria.      Relevant Orders   Urine Microscopic   Mixed hyperlipidemia    History of the same intolerant to statins?  Pending lipid panel      Relevant Orders   Hemoglobin A1c   Lipid panel   Preventative health care - Primary    Discussed age-appropriate immunizations and screening exams.  Did review patient's personal, surgical, social, family histories.  Patient is up-to-date on all age-appropriate vaccinations that he would like.  He deferred tetanus and flu vaccine today.  Patient also refused colonoscopy or Cologuard.  Will do a PSA for prostate cancer screening.  Patient deferred LDCT for lung cancer screening.  Patient can reach out to the office at any point in time and I will be happy to put these orders in.  Patient was given information at discharge about preventative healthcare maintenance with anticipatory guidance      Relevant Orders   CBC   Comprehensive metabolic panel   TSH   Overweight    Pending TSH A1c and lipid panel today.      Relevant Orders   Hemoglobin A1c   Other Visit Diagnoses     Screening for prostate cancer       Relevant Orders   PSA       Return in about 1 year (around 08/08/2024) for CPE and Labs.    Audria Nine, NP

## 2023-08-09 NOTE — Patient Instructions (Signed)
Nice to see you today I will be in touch with the labs once I have reviewed them Follow up with me in 1 year, sooner if you need me

## 2023-08-09 NOTE — Assessment & Plan Note (Signed)
Discussed age-appropriate immunizations and screening exams.  Did review patient's personal, surgical, social, family histories.  Patient is up-to-date on all age-appropriate vaccinations that he would like.  He deferred tetanus and flu vaccine today.  Patient also refused colonoscopy or Cologuard.  Will do a PSA for prostate cancer screening.  Patient deferred LDCT for lung cancer screening.  Patient can reach out to the office at any point in time and I will be happy to put these orders in.  Patient was given information at discharge about preventative healthcare maintenance with anticipatory guidance

## 2023-08-09 NOTE — Assessment & Plan Note (Signed)
Pending TSH A1c and lipid panel today.

## 2023-08-09 NOTE — Assessment & Plan Note (Signed)
History of the same intolerant to statins?  Pending lipid panel

## 2023-08-09 NOTE — Assessment & Plan Note (Signed)
Check urine microscopy for microscopic hematuria

## 2023-08-10 LAB — COMPREHENSIVE METABOLIC PANEL
ALT: 31 U/L (ref 0–53)
AST: 39 U/L — ABNORMAL HIGH (ref 0–37)
Albumin: 4 g/dL (ref 3.5–5.2)
Alkaline Phosphatase: 86 U/L (ref 39–117)
BUN: 14 mg/dL (ref 6–23)
CO2: 26 meq/L (ref 19–32)
Calcium: 9.1 mg/dL (ref 8.4–10.5)
Chloride: 105 meq/L (ref 96–112)
Creatinine, Ser: 1 mg/dL (ref 0.40–1.50)
GFR: 79.81 mL/min (ref >60.00)
Glucose, Bld: 90 mg/dL (ref 70–99)
Potassium: 4.2 meq/L (ref 3.5–5.1)
Sodium: 137 meq/L (ref 135–145)
Total Bilirubin: 0.6 mg/dL (ref 0.2–1.2)
Total Protein: 6.6 g/dL (ref 6.0–8.3)

## 2023-08-10 LAB — LIPID PANEL
Cholesterol: 192 mg/dL (ref 0–200)
HDL: 38.6 mg/dL — ABNORMAL LOW (ref >39.00)
LDL Cholesterol: 110 mg/dL — ABNORMAL HIGH (ref 0–99)
NonHDL: 152.95
Total CHOL/HDL Ratio: 5
Triglycerides: 213 mg/dL — ABNORMAL HIGH (ref 0.0–149.0)
VLDL: 42.6 mg/dL — ABNORMAL HIGH (ref 0.0–40.0)

## 2023-08-10 LAB — CBC
HCT: 49.5 % (ref 39.0–52.0)
Hemoglobin: 16.8 g/dL (ref 13.0–17.0)
MCHC: 33.9 g/dL (ref 30.0–36.0)
MCV: 99.3 fL (ref 78.0–100.0)
Platelets: 160 10*3/uL (ref 150.0–400.0)
RBC: 4.99 Mil/uL (ref 4.22–5.81)
RDW: 14.1 % (ref 11.5–15.5)
WBC: 6.9 10*3/uL (ref 4.0–10.5)

## 2023-08-10 LAB — URINALYSIS, MICROSCOPIC ONLY

## 2023-08-10 LAB — TSH: TSH: 2.58 u[IU]/mL (ref 0.35–5.50)

## 2023-08-10 LAB — HEMOGLOBIN A1C: Hgb A1c MFr Bld: 5.4 % (ref 4.6–6.5)

## 2023-08-10 LAB — PSA: PSA: 1.02 ng/mL (ref 0.10–4.00)
